# Patient Record
Sex: Female | Born: 1995 | Race: Black or African American | Hispanic: No | Smoking: Current every day smoker
Health system: Southern US, Community
[De-identification: ages and names within clinical notes are randomized; demographics above are authoritative.]

## PROBLEM LIST (undated history)

## (undated) DIAGNOSIS — F32A Depression, unspecified: Secondary | ICD-10-CM

## (undated) DIAGNOSIS — F99 Mental disorder, not otherwise specified: Secondary | ICD-10-CM

## (undated) DIAGNOSIS — D649 Anemia, unspecified: Secondary | ICD-10-CM

## (undated) DIAGNOSIS — R519 Headache, unspecified: Secondary | ICD-10-CM

## (undated) DIAGNOSIS — R51 Headache: Secondary | ICD-10-CM

## (undated) DIAGNOSIS — J45909 Unspecified asthma, uncomplicated: Secondary | ICD-10-CM

## (undated) DIAGNOSIS — R569 Unspecified convulsions: Secondary | ICD-10-CM

## (undated) DIAGNOSIS — F419 Anxiety disorder, unspecified: Secondary | ICD-10-CM

## (undated) DIAGNOSIS — F329 Major depressive disorder, single episode, unspecified: Secondary | ICD-10-CM

## (undated) HISTORY — PX: EXTERNAL EAR SURGERY: SHX627

## (undated) HISTORY — PX: HAND SURGERY: SHX662

## (undated) HISTORY — DX: Depression, unspecified: F32.A

## (undated) SURGERY — Surgical Case
Anesthesia: *Unknown

---
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
## 2015-06-08 ENCOUNTER — Emergency Department (HOSPITAL_COMMUNITY)
Admission: EM | Admit: 2015-06-08 | Discharge: 2015-06-09 | Payer: Medicaid Other | Attending: Emergency Medicine | Admitting: Emergency Medicine

## 2015-06-08 ENCOUNTER — Encounter (HOSPITAL_COMMUNITY): Payer: Self-pay | Admitting: Emergency Medicine

## 2015-06-08 DIAGNOSIS — Z79899 Other long term (current) drug therapy: Secondary | ICD-10-CM | POA: Insufficient documentation

## 2015-06-08 DIAGNOSIS — F329 Major depressive disorder, single episode, unspecified: Secondary | ICD-10-CM

## 2015-06-08 DIAGNOSIS — F32A Depression, unspecified: Secondary | ICD-10-CM

## 2015-06-08 HISTORY — DX: Unspecified convulsions: R56.9

## 2015-06-08 LAB — RAPID URINE DRUG SCREEN, HOSP PERFORMED
Amphetamines: NOT DETECTED
Barbiturates: NOT DETECTED
Benzodiazepines: NOT DETECTED
COCAINE: NOT DETECTED
Opiates: NOT DETECTED
TETRAHYDROCANNABINOL: NOT DETECTED

## 2015-06-08 LAB — CBC
HCT: 40.4 % (ref 36.0–46.0)
Hemoglobin: 13.4 g/dL (ref 12.0–15.0)
MCH: 28.5 pg (ref 26.0–34.0)
MCHC: 33.2 g/dL (ref 30.0–36.0)
MCV: 86 fL (ref 78.0–100.0)
Platelets: 249 10*3/uL (ref 150–400)
RBC: 4.7 MIL/uL (ref 3.87–5.11)
RDW: 14 % (ref 11.5–15.5)
WBC: 6.2 10*3/uL (ref 4.0–10.5)

## 2015-06-08 LAB — COMPREHENSIVE METABOLIC PANEL
ALK PHOS: 79 U/L (ref 38–126)
ALT: 14 U/L (ref 14–54)
ANION GAP: 10 (ref 5–15)
AST: 19 U/L (ref 15–41)
Albumin: 4.5 g/dL (ref 3.5–5.0)
BILIRUBIN TOTAL: 0.7 mg/dL (ref 0.3–1.2)
BUN: 12 mg/dL (ref 6–20)
CO2: 23 mmol/L (ref 22–32)
CREATININE: 0.85 mg/dL (ref 0.44–1.00)
Calcium: 9.6 mg/dL (ref 8.9–10.3)
Chloride: 104 mmol/L (ref 101–111)
GFR calc non Af Amer: >60 mL/min (ref >60)
Glucose, Bld: 115 mg/dL — ABNORMAL HIGH (ref 65–99)
Potassium: 3.8 mmol/L (ref 3.5–5.1)
SODIUM: 137 mmol/L (ref 135–145)
TOTAL PROTEIN: 7.8 g/dL (ref 6.5–8.1)

## 2015-06-08 LAB — SALICYLATE LEVEL: Salicylate Lvl: <4 mg/dL (ref 2.8–30.0)

## 2015-06-08 LAB — ETHANOL: Alcohol, Ethyl (B): <5 mg/dL (ref <5)

## 2015-06-08 LAB — ACETAMINOPHEN LEVEL

## 2015-06-08 NOTE — ED Notes (Signed)
Pt. wanded by security at triage .  

## 2015-06-08 NOTE — ED Notes (Signed)
Pt placed in wine colored scrubs 

## 2015-06-08 NOTE — ED Notes (Signed)
Pt. reports suicidal ideation plans to jump over a bridge , denies visual/auditory hallucinations .

## 2015-06-08 NOTE — ED Provider Notes (Signed)
CSN: [id number]     Arrival date & time 06/08/15  2208 History   First MD Initiated Contact with Patient 06/08/15 2354     Chief Complaint  Patient presents with  . Suicidal     (Consider location/radiation/quality/duration/timing/severity/associated sxs/prior Treatment) The history is provided by the patient.  Patient notes hx ptsd, and states has been feeling very depressed lately relating to recent issues with friend/girlfriend, and being kicked out of grandparents home where she had been staying.  Patient indicates she now is having thoughts of jumping off a bridge. Denies any attempt at harming self, or od.  Patient initially had been in ED with her girlfriend who had checked in as patient, and then decided to check in and be seen herself. Currently/newly homeless.  States normal appetite. No recent illness or physical symptoms.       Past Medical History  Diagnosis Date  . Mental disorder   . Depression   . Seizures [facility](HCC)    Past Surgical History  Procedure Laterality Date  . Foreign body removal Right 03/16/2013    Procedure: REMOVAL FOREIGN BODY EXTREMITY;  Surgeon: Sharma CovertFred W Ortmann, MD;  Location: [facility] OR;  Service: Orthopedics;  Laterality: Right;  . Hand surgery    . External ear surgery     No family history on file. Social History  Substance Use Topics  . Smoking status: Never Smoker   . Smokeless tobacco: Never Used  . Alcohol Use: No   OB History    No data available     Review of Systems  Constitutional: Negative for fever.  HENT: Negative for sore throat.   Eyes: Negative for redness.  Respiratory: Negative for shortness of breath.   Cardiovascular: Negative for chest pain.  Gastrointestinal: Negative for vomiting and abdominal pain.  Genitourinary: Negative for flank pain.  Musculoskeletal: Negative for back pain and neck pain.  Skin: Negative for rash.  Neurological: Negative for headaches.  Hematological: Does not bruise/bleed easily.   Psychiatric/Behavioral: Positive for dysphoric mood.      Allergies  Bee venom  Home Medications   Prior to Admission medications   Medication Sig Start Date End Date Taking? Authorizing Provider  acetaminophen (TYLENOL) 325 MG tablet Take 650 mg by mouth every 6 (six) hours as needed for mild pain.    Historical Provider, MD  albuterol (PROVENTIL HFA;VENTOLIN HFA) 108 (90 BASE) MCG/ACT inhaler Inhale 2 puffs into the lungs every 6 (six) hours as needed for wheezing. For wheezing 03/07/13   Jolene SchimkeKim B Winson, NP  cloNIDine (CATAPRES) 0.1 MG tablet Take 0.05-0.1 mg by mouth 3 (three) times daily. Take 1 tablet every morning and every evening and take 1/2 tablet at lunch 03/07/13   Jolene SchimkeKim B Winson, NP  docusate sodium (COLACE) 100 MG capsule Take 100 mg by mouth daily.    Historical Provider, MD  escitalopram (LEXAPRO) 10 MG tablet Take 10 mg by mouth daily.    Historical Provider, MD  ibuprofen (ADVIL,MOTRIN) 600 MG tablet Take 1 tablet (600 mg total) by mouth every 6 (six) hours as needed. 10/29/13   Francee PiccoloJennifer Piepenbrink, PA-C  Melatonin 3 MG TABS Take 1 tablet by mouth at bedtime.    Historical Provider, MD  polyethylene glycol (MIRALAX / GLYCOLAX) packet Take 17 g by mouth daily.    Historical Provider, MD  QUEtiapine (SEROQUEL) 100 MG tablet Take 100-150 mg by mouth 2 (two) times daily. Take 1 tablet every morning and take 1 and 1/2 tablet at lunch  Historical Provider, MD  QUEtiapine (SEROQUEL) 300 MG tablet Take 300 mg by mouth at bedtime. Take one in the morning and two at night    Historical Provider, MD   BP 121/74 mmHg  Pulse 90  Temp(Src) 98.2 F (36.8 C) (Oral)  Resp 18  Wt 72.32 kg  SpO2 100%  LMP  Physical Exam  Constitutional: She is oriented to person, place, and time. She appears well-developed and well-nourished. No distress.  HENT:  Mouth/Throat: Oropharynx is clear and moist.  Eyes: Conjunctivae are normal. Pupils are equal, round, and reactive to light. No scleral  icterus.  Neck: Neck supple. No tracheal deviation present.  Cardiovascular: Normal rate, regular rhythm, normal heart sounds and intact distal pulses.   Pulmonary/Chest: Effort normal and breath sounds normal. No respiratory distress.  Abdominal: Normal appearance. She exhibits no distension. There is no tenderness.  Musculoskeletal: She exhibits no edema.  Neurological: She is alert and oriented to person, place, and time.  Steady gait.   Skin: Skin is warm and dry. No rash noted. She is not diaphoretic.  Psychiatric: She has a normal mood and affect.  Pt voices SI, while is alert, smiling, interactive, conversant, and appearing to exhibit normal mood/affect.   Nursing note and vitals reviewed.   ED Course  Procedures (including critical care time) Labs Review   Results for orders placed or performed during the hospital encounter of 06/08/15  Comprehensive metabolic panel  Result Value Ref Range   Sodium 137 135 - 145 mmol/L   Potassium 3.8 3.5 - 5.1 mmol/L   Chloride 104 101 - 111 mmol/L   CO2 23 22 - 32 mmol/L   Glucose, Bld 115 (H) 65 - 99 mg/dL   BUN 12 6 - 20 mg/dL   Creatinine, Ser 1.61 0.44 - 1.00 mg/dL   Calcium 9.6 8.9 - 09.6 mg/dL   Total Protein 7.8 6.5 - 8.1 g/dL   Albumin 4.5 3.5 - 5.0 g/dL   AST 19 15 - 41 U/L   ALT 14 14 - 54 U/L   Alkaline Phosphatase 79 38 - 126 U/L   Total Bilirubin 0.7 0.3 - 1.2 mg/dL   GFR calc non Af Amer >60 >60 mL/min   GFR calc Af Amer >60 >60 mL/min   Anion gap 10 5 - 15  Ethanol (ETOH)  Result Value Ref Range   Alcohol, Ethyl (B) <5 <5 mg/dL  Salicylate level  Result Value Ref Range   Salicylate Lvl <4.0 2.8 - 30.0 mg/dL  Acetaminophen level  Result Value Ref Range   Acetaminophen (Tylenol), Serum <10 (L) 10 - 30 ug/mL  CBC  Result Value Ref Range   WBC 6.2 4.0 - 10.5 K/uL   RBC 4.70 3.87 - 5.11 MIL/uL   Hemoglobin 13.4 12.0 - 15.0 g/dL   HCT 04.5 40.9 - 81.1 %   MCV 86.0 78.0 - 100.0 fL   MCH 28.5 26.0 - 34.0 pg    MCHC 33.2 30.0 - 36.0 g/dL   RDW 91.4 78.2 - 95.6 %   Platelets 249 150 - 400 K/uL  Urine rapid drug screen (hosp performed) (Not at [facility])  Result Value Ref Range   Opiates NONE DETECTED NONE DETECTED   Cocaine NONE DETECTED NONE DETECTED   Benzodiazepines NONE DETECTED NONE DETECTED   Amphetamines NONE DETECTED NONE DETECTED   Tetrahydrocannabinol NONE DETECTED NONE DETECTED   Barbiturates NONE DETECTED NONE DETECTED      I have personally reviewed and evaluated these lab results  as part of my medical decision-making.    MDM   Labs.  Behavioral health team consulted.  Temp psych holding orders placed.  Disposition per psych team.  Reviewed nursing notes and prior charts for additional history.      Cathren Laine, MD 06/09/15 Marlyne Beards

## 2015-06-08 NOTE — ED Notes (Signed)
Staffing office notified for pt.'s sitter , security notified to wand pt. , purple scrubs given to pt. At triage .

## 2015-06-09 MED ORDER — IBUPROFEN 400 MG PO TABS
600.0000 mg | ORAL_TABLET | Freq: Four times a day (QID) | ORAL | Status: DC | PRN
  Administered 2015-06-09: 600 mg via ORAL
  Filled 2015-06-09: qty 1

## 2015-06-09 NOTE — ED Provider Notes (Signed)
  Physical Exam  BP 100/54 mmHg  Pulse 77  Temp(Src) 98.2 F (36.8 C) (Oral)  Resp 16  Wt 159 lb 7 oz (72.32 kg)  SpO2 98%  LMP   Physical Exam  ED Course  Procedures  MDM Accepted at [facility]. By Dr. Wendall StadeKohl. Patient reevaluated by myself before transfer.      Benjiman CoreNathan Ahri Olson, MD 06/09/15 1048
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
## 2015-06-09 NOTE — Progress Notes (Addendum)
Discussed pt's case with psych team.   Pt has been accepted at [facility] by Dr. Wendall StadeKohl. Per Christiane HaJonathan in intake, report can be called at [phone]. Admission is voluntary and pt can arrive anytime.   Spoke with [facility] re: pt's placement  Ilean SkillMeghan Addilyn Satterwhite, MSW, LCSW Clinical Social Work, Disposition  06/09/2015 [phone]
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
## 2015-06-09 NOTE — ED Notes (Signed)
Patient was given a snack and drink. Regular diet order taken for lunch. 

## 2015-06-09 NOTE — ED Notes (Signed)
Belonging given to [employer] driver.

## 2015-06-28 ENCOUNTER — Emergency Department (HOSPITAL_COMMUNITY)
Admission: EM | Admit: 2015-06-28 | Discharge: 2015-06-28 | Disposition: A | Discharge status: Home / Self Care | Payer: Medicaid Other | Attending: Emergency Medicine | Admitting: Emergency Medicine

## 2015-06-28 ENCOUNTER — Encounter (HOSPITAL_COMMUNITY): Payer: Self-pay | Admitting: *Deleted

## 2015-06-28 DIAGNOSIS — R569 Unspecified convulsions: Secondary | ICD-10-CM | POA: Insufficient documentation

## 2015-06-28 DIAGNOSIS — F445 Conversion disorder with seizures or convulsions: Secondary | ICD-10-CM

## 2015-06-28 DIAGNOSIS — Z8659 Personal history of other mental and behavioral disorders: Secondary | ICD-10-CM | POA: Insufficient documentation

## 2015-06-28 DIAGNOSIS — Z79899 Other long term (current) drug therapy: Secondary | ICD-10-CM | POA: Insufficient documentation

## 2015-06-28 NOTE — ED Provider Notes (Signed)
CSN: [id number]     Arrival date & time 06/28/15  1757 History   First MD Initiated Contact with Patient 06/28/15 1813     Chief Complaint  Patient presents with  . Seizures     (Consider location/radiation/quality/duration/timing/severity/associated sxs/prior Treatment) Patient is a 19 y.o. female presenting with seizures. The history is provided by the patient. No language interpreter was used.  Seizures Seizure activity on arrival: yes   Seizure type: hx of pseudoseizures. Preceding symptoms: no sensation of an aura present, no dizziness and no headache   Initial focality:  None Episode characteristics: generalized shaking   Return to baseline: yes   Severity:  Unable to specify Timing:  Once Number of seizures this episode:  1 Progression:  Worsening Context: not alcohol withdrawal, not cerebral palsy, not previous head injury and not stress   Pt is bipolar and has a history of pseudoseizures.  Pt is suppose to take depakote but does not take  Past Medical History  Diagnosis Date  . Mental disorder   . Depression   . Seizures [facility](HCC)    Past Surgical History  Procedure Laterality Date  . Foreign body removal Right 03/16/2013    Procedure: REMOVAL FOREIGN BODY EXTREMITY;  Surgeon: Sharma CovertFred W Ortmann, [place];  Location: [facility] OR;  Service: Orthopedics;  Laterality: Right;  . Hand surgery    . External ear surgery     History reviewed. No pertinent family history. Social History  Substance Use Topics  . Smoking status: Never Smoker   . Smokeless tobacco: Never Used  . Alcohol Use: No   OB History    No data available     Review of Systems  Neurological: Positive for seizures.  All other systems reviewed and are negative.     Allergies  Bee venom  Home Medications   Prior to Admission medications   Medication Sig Start Date End Date Taking? Authorizing Provider  albuterol (PROVENTIL HFA;VENTOLIN HFA) 108 (90 BASE) MCG/ACT inhaler Inhale 2 puffs into the lungs every 6  (six) hours as needed for wheezing. For wheezing 03/07/13  Yes Jolene SchimkeKim B Winson, NP  divalproex (DEPAKOTE) 500 MG DR tablet Take 500 mg by mouth 3 (three) times daily.   Yes Historical Provider, MD  lurasidone (LATUDA) 40 MG TABS tablet Take 40 mg by mouth daily with breakfast.   Yes Historical Provider, MD  cloNIDine (CATAPRES) 0.1 MG tablet Take 0.05-0.1 mg by mouth 3 (three) times daily. Take 1 tablet every morning and every evening and take 1/2 tablet at lunch 03/07/13   Jolene SchimkeKim B Winson, NP   BP 128/85 mmHg  Pulse 79  Temp(Src) 98.1 F (36.7 C) (Oral)  Resp 14  SpO2 100% Physical Exam  Constitutional: She is oriented to person, place, and time. She appears well-developed and well-nourished.  HENT:  Head: Normocephalic and atraumatic.  Right Ear: External ear normal.  Mouth/Throat: Oropharynx is clear and moist.  Eyes: Conjunctivae and EOM are normal. Pupils are equal, round, and reactive to light.  Neck: Normal range of motion.  Cardiovascular: Normal rate and regular rhythm.   Pulmonary/Chest: Effort normal and breath sounds normal.  Abdominal: Soft. She exhibits no distension.  Musculoskeletal: Normal range of motion.  Neurological: She is alert and oriented to person, place, and time.  Skin: Skin is warm.  Psychiatric: She has a normal mood and affect.  Nursing note and vitals reviewed.   ED Course  Procedures (including critical care time) Labs Review Labs Reviewed - No data to  display  Imaging Review No results found. I have personally reviewed and evaluated these images and lab results as part of my medical decision-making.   EKG Interpretation   Date/Time:  Monday June 28 2015 18:04:22 EST Ventricular Rate:  79 PR Interval:  124 QRS Duration: 68 QT Interval:  335 QTC Calculation: 384 R Axis:   82 Text Interpretation:  Sinus rhythm No significant change since last  tracing Confirmed by Denton Lank  MD, Caryn Bee ([id number]) on 06/28/2015 7:08:10 PM      MDM   Final  diagnoses:  Pseudoseizure (HCC)    Pt observed x 1.5 hours.  Pt denies current depression or anxiety.   I advised pt to take her depakote as directed.     Lonia Skinner [place], PA-C 06/28/15 1923  Cathren Laine, MD 06/29/15 [phone]

## 2015-06-28 NOTE — Discharge Instructions (Signed)
Nonepileptic Seizures °Nonepileptic seizures are seizures that are not caused by abnormal electrical signals in your brain. These seizures often seem like epileptic seizures, but they are not caused by epilepsy.  °There are two types of nonepileptic seizures: °· A physiologic nonepileptic seizure results from a disruption in your brain. °· A psychogenic seizure results from emotional stress. These seizures are sometimes called pseudoseizures. °CAUSES  °Causes of physiologic nonepileptic seizures include:  °· Sudden drop in blood pressure. °· Low blood sugar. °· Low levels of salt (sodium) in your blood. °· Low levels of calcium in your blood. °· Migraine. °· Heart rhythm problems. °· Sleep disorders. °· Drug and alcohol abuse. °Common causes of psychogenic nonepileptic seizures include: °· Stress. °· Emotional trauma. °· Sexual or physical abuse. °· Major life events, such as divorce or the death of a loved one. °· Mental health disorders, including panic attack and hyperactivity disorder. °SIGNS AND SYMPTOMS °A nonepileptic seizure can look like an epileptic seizure, including uncontrollable shaking (convulsions), or changes in attention, behavior, or the ability to remain awake and alert. However, there are some differences. Nonepileptic seizures usually: °· Do not cause physical injuries. °· Start slowly. °· Include crying or shrieking. °· Last longer than 2 minutes. °· Have a short recovery time without headache or exhaustion. °DIAGNOSIS  °Your health care provider can usually diagnose nonepileptic seizures after taking your medical history and giving you a physical exam. Your health care provider may want to talk to your friends or relatives who have seen you have a seizure.  °You may also need to have tests to look for causes of physiologic nonepileptic seizures. This may include an electroencephalogram (EEG), which is a test that measures electrical activity in your brain. If you have had an epileptic  seizure, the results of your EEG will be abnormal. If your health care provider thinks you have had a psychogenic nonepileptic seizure, you may need to see a mental health specialist for an evaluation. °TREATMENT  °Treatment depends on the type and cause of your seizures. °· For physiologic nonepileptic seizures, treatment is aimed at addressing the underlying condition that caused the seizures. These seizures usually stop when the underlying condition is properly treated. °· Nonepileptic seizures do not respond to the seizure medicines used to treat epilepsy. °· For psychogenic seizures, you may need to work with a mental health specialist. °HOME CARE INSTRUCTIONS °Home care will depend on the type of nonepileptic seizures you have.  °· Follow all your health care provider's instructions. °· Keep all your follow-up appointments. °SEEK MEDICAL CARE IF: °You continue to have seizures after treatment. °SEEK IMMEDIATE MEDICAL CARE IF: °· Your seizures change or become more frequent. °· You injure yourself during a seizure. °· You have one seizure after another. °· You have trouble recovering from a seizure. °· You have chest pain or trouble breathing. °MAKE SURE YOU: °· Understand these instructions. °· Will watch your condition. °· Will get help right away if you are not doing well or get worse. °  °This information is not intended to replace advice given to you by your health care provider. Make sure you discuss any questions you have with your health care provider. °  °Document Released: 08/11/2005 Document Revised: 07/17/2014 Document Reviewed: 04/22/2013 °Elsevier Interactive Patient Education ©2016 Elsevier Inc. ° °

## 2015-06-28 NOTE — ED Notes (Signed)
Pt arrives from home via [facility]. Pt states she has a hx of pseudoseizures and bipolar disorder. Pt states she began having some shaking at 0600 today and fell down her stairs onto her right side and has c/o right sided rib pain. Pt states at 1730 pt began having eye rolling so called EMS. Pt was at home alone for both occurences. Pt also states she was prescribed depakote and took one dose on Wednesday and hasn't had anymore. Pt denies neck or back pain, loss of bowel or bladder fx or tongue injury.

## 2015-07-14 ENCOUNTER — Other Ambulatory Visit: Payer: Self-pay

## 2015-07-14 ENCOUNTER — Encounter (HOSPITAL_COMMUNITY): Payer: Self-pay | Admitting: *Deleted

## 2015-07-14 ENCOUNTER — Emergency Department (HOSPITAL_COMMUNITY)
Admission: EM | Admit: 2015-07-14 | Discharge: 2015-07-16 | Disposition: A | Discharge status: Other Acute Inpatient Hospital | Payer: Self-pay | Attending: Emergency Medicine | Admitting: Emergency Medicine

## 2015-07-14 DIAGNOSIS — T50904A Poisoning by unspecified drugs, medicaments and biological substances, undetermined, initial encounter: Secondary | ICD-10-CM

## 2015-07-14 DIAGNOSIS — T39314A Poisoning by propionic acid derivatives, undetermined, initial encounter: Secondary | ICD-10-CM | POA: Insufficient documentation

## 2015-07-14 DIAGNOSIS — X58XXXA Exposure to other specified factors, initial encounter: Secondary | ICD-10-CM | POA: Insufficient documentation

## 2015-07-14 DIAGNOSIS — Z3202 Encounter for pregnancy test, result negative: Secondary | ICD-10-CM | POA: Insufficient documentation

## 2015-07-14 DIAGNOSIS — Y998 Other external cause status: Secondary | ICD-10-CM | POA: Insufficient documentation

## 2015-07-14 DIAGNOSIS — Y9289 Other specified places as the place of occurrence of the external cause: Secondary | ICD-10-CM | POA: Insufficient documentation

## 2015-07-14 DIAGNOSIS — R4182 Altered mental status, unspecified: Secondary | ICD-10-CM | POA: Insufficient documentation

## 2015-07-14 DIAGNOSIS — Y9389 Activity, other specified: Secondary | ICD-10-CM | POA: Insufficient documentation

## 2015-07-14 DIAGNOSIS — F329 Major depressive disorder, single episode, unspecified: Secondary | ICD-10-CM | POA: Diagnosis present

## 2015-07-14 LAB — I-STAT CG4 LACTIC ACID, ED: Lactic Acid, Venous: 1.81 mmol/L (ref 0.5–2.0)

## 2015-07-14 LAB — COMPREHENSIVE METABOLIC PANEL
ALBUMIN: 4 g/dL (ref 3.5–5.0)
ALK PHOS: 71 U/L (ref 38–126)
ALT: 11 U/L — ABNORMAL LOW (ref 14–54)
ANION GAP: 11 (ref 5–15)
AST: 16 U/L (ref 15–41)
BILIRUBIN TOTAL: 0.7 mg/dL (ref 0.3–1.2)
CALCIUM: 9.6 mg/dL (ref 8.9–10.3)
CO2: 21 mmol/L — ABNORMAL LOW (ref 22–32)
Chloride: 107 mmol/L (ref 101–111)
Creatinine, Ser: 0.68 mg/dL (ref 0.44–1.00)
GFR calc Af Amer: >60 mL/min (ref >60)
GLUCOSE: 106 mg/dL — AB (ref 65–99)
Potassium: 3.5 mmol/L (ref 3.5–5.1)
Sodium: 139 mmol/L (ref 135–145)
TOTAL PROTEIN: 7.7 g/dL (ref 6.5–8.1)

## 2015-07-14 LAB — I-STAT VENOUS BLOOD GAS, ED
Acid-Base Excess: 2 mmol/L (ref 0.0–2.0)
Bicarbonate: 24.9 mEq/L — ABNORMAL HIGH (ref 20.0–24.0)
O2 Saturation: 86 %
PCO2 VEN: 34.6 mmHg — AB (ref 45.0–50.0)
PH VEN: 7.464 — AB (ref 7.250–7.300)
TCO2: 26 mmol/L (ref 0–100)
pO2, Ven: 48 mmHg — ABNORMAL HIGH (ref 30.0–45.0)

## 2015-07-14 LAB — SALICYLATE LEVEL

## 2015-07-14 LAB — RAPID URINE DRUG SCREEN, HOSP PERFORMED
AMPHETAMINES: NOT DETECTED
BENZODIAZEPINES: NOT DETECTED
Barbiturates: NOT DETECTED
Cocaine: NOT DETECTED
OPIATES: NOT DETECTED
Tetrahydrocannabinol: NOT DETECTED

## 2015-07-14 LAB — POC URINE PREG, ED: Preg Test, Ur: NEGATIVE

## 2015-07-14 LAB — I-STAT BETA HCG BLOOD, ED (MC, WL, AP ONLY): I-stat hCG, quantitative: <5 m[IU]/mL (ref <5)

## 2015-07-14 LAB — LIPASE, BLOOD: Lipase: 20 U/L (ref 11–51)

## 2015-07-14 LAB — ACETAMINOPHEN LEVEL: Acetaminophen (Tylenol), Serum: <10 ug/mL — ABNORMAL LOW (ref 10–30)

## 2015-07-14 LAB — CK: Total CK: 83 U/L (ref 38–234)

## 2015-07-14 LAB — ETHANOL

## 2015-07-14 LAB — I-STAT TROPONIN, ED: TROPONIN I, POC: 0 ng/mL (ref 0.00–0.08)

## 2015-07-14 NOTE — ED Notes (Signed)
The pt is sitting in the middle of the stretcher.  Talking now to me and she is acting totally normal.  History received from the pt.  She lives with her grandmother who has not been here,

## 2015-07-14 NOTE — ED Notes (Signed)
Sitter ay the bedside.  Pt sleeping  In fluid nss added to saline lok

## 2015-07-14 NOTE — ED Notes (Signed)
The pt is talking to the sitter but no one else.  She only nods or shakes her head to questions asked by other people.  She also wants to call her grandmother according to the sitter

## 2015-07-14 NOTE — ED Notes (Signed)
Sitter at bedside.  The pt reports that she did not take advil she took gabapentin  approx 12 pills.  She does not know what time she took them.  She reports that she was not trying to kill  Herself.  She just took them to get away for awhile.  She reports that she just did not have anyone to talk to because her grandmother was not there.  She reports that mental facilities do not help her.  Her grandmother helps her

## 2015-07-14 NOTE — ED Notes (Signed)
ataffing office notified of the need for a sitter

## 2015-07-14 NOTE — ED Notes (Signed)
Poison control called asking for lab results

## 2015-07-14 NOTE — ED Notes (Signed)
Pt standing beside the bed  Shed camed out the end of the stretcher.  Both side rfails were still uprightg.  helpoed back to the bed    Groaning and making animal noises

## 2015-07-14 NOTE — ED Notes (Signed)
The pt is turning and looking at people in the room making statements.  She was shaking and looking to the rt side for apporx 203m0 seconds.  After intra-orbital pressure was applied she stopped and opened her eyes.  She is shaking her yes and no  To some questions asked.  Rectal temp obtained she did not like that.  i asked if she could void on a bedpan she shrugged her shoulders as if in question.

## 2015-07-14 NOTE — ED Notes (Signed)
The pt is opening her eyes to some statements.  Not answering question yet but she reached up and pulled her nasal trumpet out

## 2015-07-14 NOTE — ED Notes (Signed)
Pt is sitting up in the bed talking to the sitter and answering questions. Stated that she didn't know what she was doing. Thayer Ohmhris RN notified

## 2015-07-14 NOTE — ED Notes (Signed)
The pt arrived by [facility] from home  Grandmother called on the phone and the pt told her that she had taken motrin..  Pt blinks her eyes when she is being spoken to

## 2015-07-15 DIAGNOSIS — F329 Major depressive disorder, single episode, unspecified: Secondary | ICD-10-CM | POA: Diagnosis present

## 2015-07-15 MED ORDER — ACETAMINOPHEN 325 MG PO TABS
650.0000 mg | ORAL_TABLET | Freq: Four times a day (QID) | ORAL | Status: DC | PRN
  Administered 2015-07-15: 650 mg via ORAL
  Filled 2015-07-15: qty 2

## 2015-07-15 MED ORDER — ONDANSETRON 4 MG PO TBDP
4.0000 mg | ORAL_TABLET | Freq: Once | ORAL | Status: AC
  Administered 2015-07-15: 4 mg via ORAL
  Filled 2015-07-15: qty 1

## 2015-07-15 MED ORDER — ALUM & MAG HYDROXIDE-SIMETH 200-200-20 MG/5ML PO SUSP: 30.0000 mL | ORAL | Status: DC | PRN

## 2015-07-15 MED ORDER — MAGNESIUM HYDROXIDE 400 MG/5ML PO SUSP: 30.0000 mL | Freq: Every day | ORAL | Status: DC | PRN

## 2015-07-15 MED ORDER — TRAZODONE HCL 100 MG PO TABS: 100.0000 mg | ORAL_TABLET | Freq: Every evening | ORAL | Status: DC | PRN

## 2015-07-15 NOTE — ED Provider Notes (Signed)
The patient appears reasonably stabilized for transfer considering the current resources, flow, and capabilities available in the ED at this time, and I doubt any other [facility] requiring further screening and/or treatment in the ED prior to transfer.  BP 135/76 mmHg  Pulse 76  Temp(Src) 98.2 F (36.8 C) (Oral)  Resp 18  SpO2 100%  LMP 07/07/2015   Zadie Rhineonald Kevonte Vanecek, MD 07/15/15 2359

## 2015-07-15 NOTE — ED Notes (Signed)
Emily ClinesDebra Massey [phone] (Grandmother). She would like to called with placement.

## 2015-07-15 NOTE — ED Notes (Signed)
Report given to the rn in pod c

## 2015-07-15 NOTE — ED Provider Notes (Signed)
CSN: [id number]     Arrival date & time 07/14/15  1821 History   First MD Initiated Contact with Patient 07/14/15 1840     Chief Complaint  Patient presents with  . Ingestion     (Consider location/radiation/quality/duration/timing/severity/associated sxs/prior Treatment) HPI Comments: 20 year old female with unknown past medical history who presents with altered mental status. History limited because of the patient's altered mentation and obtained primarily from EMS. EMS reports that the patient's grandmother spoke to the patient on the phone and noted that she was altered and stated that she had taken Motrin. Grandmother called EMS and they found her altered, somnolent, with only intermittent opening of her eyes to voice. It is unknown whether her ingestion was intentional or accidental.  Patient is a 20 y.o. female presenting with Ingested Medication. The history is provided by the EMS personnel.  Ingestion    History reviewed. No pertinent past medical history. History reviewed. No pertinent past surgical history. No family history on file. Social History  Substance Use Topics  . Smoking status: Never Smoker   . Smokeless tobacco: None  . Alcohol Use: No   OB History    No data available     Review of Systems  Unable to perform ROS: Mental status change      Allergies  Bee pollen-1000-royal jelly  Home Medications   Prior to Admission medications   Not on File   BP 107/81 mmHg  Pulse 86  Resp 15  SpO2 95%  LMP 07/07/2015 Physical Exam  Constitutional:  Somnolent, NAD  HENT:  Head: Normocephalic and atraumatic.  R nasal trumpet, Moist mucous membranes  Eyes: Conjunctivae are normal. Pupils are equal, round, and reactive to light.  Neck: Neck supple.  Cardiovascular: Normal rate, regular rhythm and normal heart sounds.   No murmur heard. Pulmonary/Chest: Effort normal and breath sounds normal.  Abdominal: Soft. Bowel sounds are normal. She exhibits no  distension. There is no tenderness.  Musculoskeletal: She exhibits no edema or tenderness.  Neurological:  GCS 9, not answering questions or following commands  Skin: Skin is warm and dry. No rash noted.  Healed linear scars on forearms  Nursing note and vitals reviewed.   ED Course  Procedures (including critical care time) Labs Review Labs Reviewed  ACETAMINOPHEN LEVEL - Abnormal; Notable for the following:    Acetaminophen (Tylenol), Serum <10 (*)    All other components within normal limits  COMPREHENSIVE METABOLIC PANEL - Abnormal; Notable for the following:    CO2 21 (*)    Glucose, Bld 106 (*)    BUN <5 (*)    ALT 11 (*)    All other components within normal limits  I-STAT VENOUS BLOOD GAS, ED - Abnormal; Notable for the following:    pH, Ven 7.464 (*)    pCO2, Ven 34.6 (*)    pO2, Ven 48.0 (*)    Bicarbonate 24.9 (*)    All other components within normal limits  ETHANOL  LIPASE, BLOOD  SALICYLATE LEVEL  CK  URINE RAPID DRUG SCREEN, HOSP PERFORMED  BLOOD GAS, VENOUS  I-STAT CG4 LACTIC ACID, ED  I-STAT BETA HCG BLOOD, ED ([facility], [facility], [facility] ONLY)  I-STAT TROPOININ, ED  POC URINE PREG, ED    Imaging Review No results found. I have personally reviewed and evaluated these images and lab results as part of my medical decision-making.   EKG Interpretation None      Sinus rhythm, normal rate, normal axis, no QTC prolongation MDM  Final diagnoses:  Overdose, undetermined intent, initial encounter  Altered mental status, unspecified altered mental status type   patient presents by EMS for altered mental status and concern for overdose. On arrival, she was somnolent but with stable vital signs. She initially would not respond to voice or painful stimuli but within 10-15 minutes of arrival, she became more responsive. She later stated to me that she took 12, 800 mg tablets of her grandparent's gabapentin. She denied trying to kill herself with it. EKG unremarkable. Obtained  above labs. Spoke w/ poison control, who recommended 6 hour observation period. labs showed normal lactate, negative UDS, negative Tylenol, salicylate, and alcohol. I am concerned about the patient's large ingestion as a possible suicide attempt. The patient has become more responsive during her ED course and is now conversant with staff. The patient has a sitter at bedside and I have contacted TTS for evaluation. Pt's disposition will be determined by psychiatry team recommendations.   Laurence Spates, MD 07/15/15 [phone]

## 2015-07-15 NOTE — ED Notes (Signed)
Just met pt in rm.  Bagged pt's robe for inventory.

## 2015-07-15 NOTE — ED Notes (Signed)
Sitter at the bedside.  The pt is now just being silly.  She has been speaking in a baby voice intermittently.  She was demanding to have the iv removed edp agreed. She had all her vital sign taking equipment removed.  Drinking gingerale.  tts being attempted.  Adult machine not working  Peds machine brought for her tts

## 2015-07-15 NOTE — ED Notes (Signed)
Patient given updates.
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
## 2015-07-15 NOTE — BHH Counselor (Signed)
Per Donell SievertSpencer Simon, PA, Pt meets inpt tx criteria. Per Clint Bolderori Beck, [facility], [facility]. TTS to seek placement at outside facilities in the meantime.  Counselor informed EDP of disposition.   - Cyndie MullAnna Koleton Duchemin, [facility]   Therapeutic Triage    Ext [phone]

## 2015-07-15 NOTE — ED Notes (Signed)
Pt placed in scrubs  And moved to pod c.  Security will wand

## 2015-07-15 NOTE — BH Assessment (Signed)
[facility]  Accepting physician is Dr. Ardyth HarpsHernandez.  Attending Physician will be Dr. Ardyth HarpsHernandez.  Patient [facility] room 303[facility] Charge Nurse Continental CourtsGwen.  Call report to [phone].  Representative/Transfer Coordinator is Sander Remedios.  [facility] Staff (Jody, Social Worker) made aware of acceptance.   Patient is able to come to the unit after 9pm.  That is when their bed will be available.

## 2015-07-15 NOTE — ED Provider Notes (Signed)
BP 108/70 mmHg  Pulse 60  Temp(Src) 98.2 F (36.8 C) (Oral)  Resp 18  SpO2 100%  LMP 07/07/2015 Pt awaiting placement at this time Pt has been eating/drinking per nursing  Zadie Rhineonald Jahad Old, MD 07/15/15 1625

## 2015-07-15 NOTE — ED Notes (Signed)
Patient was given a snack and drink, A regular diet order taken for lunch. 

## 2015-07-15 NOTE — ED Notes (Signed)
Patient given snack.  

## 2015-07-15 NOTE — ED Notes (Signed)
Patient at nursing station on the phone with grandparents.

## 2015-07-15 NOTE — Progress Notes (Signed)
Seeking inpatient placement for pt. [facility].  [facility]  Declined: [facility]- acuity too high for unit at this time per [facility].  Ilean SkillMeghan Malikah Lakey, MSW, LCSW Clinical Social Work, Disposition  07/15/2015 [phone]

## 2015-07-15 NOTE — ED Notes (Signed)
Patient was given a ginger ale.  

## 2015-07-16 ENCOUNTER — Inpatient Hospital Stay
Admit: 2015-07-16 | Discharge: 2015-07-21 | DRG: 881 | Disposition: A | Discharge status: Home / Self Care | Payer: Medicaid Other | Source: Other Acute Inpatient Hospital | Attending: Psychiatry | Admitting: Psychiatry

## 2015-07-16 DIAGNOSIS — F1210 Cannabis abuse, uncomplicated: Secondary | ICD-10-CM

## 2015-07-16 DIAGNOSIS — F1290 Cannabis use, unspecified, uncomplicated: Secondary | ICD-10-CM | POA: Diagnosis present

## 2015-07-16 DIAGNOSIS — F4310 Post-traumatic stress disorder, unspecified: Secondary | ICD-10-CM | POA: Diagnosis present

## 2015-07-16 DIAGNOSIS — Z91410 Personal history of adult physical and sexual abuse: Secondary | ICD-10-CM

## 2015-07-16 DIAGNOSIS — F17210 Nicotine dependence, cigarettes, uncomplicated: Secondary | ICD-10-CM | POA: Diagnosis present

## 2015-07-16 DIAGNOSIS — Z818 Family history of other mental and behavioral disorders: Secondary | ICD-10-CM

## 2015-07-16 DIAGNOSIS — T426X2A Poisoning by other antiepileptic and sedative-hypnotic drugs, intentional self-harm, initial encounter: Secondary | ICD-10-CM | POA: Diagnosis present

## 2015-07-16 DIAGNOSIS — Z915 Personal history of self-harm: Secondary | ICD-10-CM | POA: Diagnosis not present

## 2015-07-16 DIAGNOSIS — F909 Attention-deficit hyperactivity disorder, unspecified type: Secondary | ICD-10-CM | POA: Diagnosis present

## 2015-07-16 DIAGNOSIS — Z87898 Personal history of other specified conditions: Secondary | ICD-10-CM

## 2015-07-16 DIAGNOSIS — J45909 Unspecified asthma, uncomplicated: Secondary | ICD-10-CM | POA: Diagnosis present

## 2015-07-16 DIAGNOSIS — F419 Anxiety disorder, unspecified: Secondary | ICD-10-CM | POA: Diagnosis present

## 2015-07-16 DIAGNOSIS — F329 Major depressive disorder, single episode, unspecified: Principal | ICD-10-CM | POA: Diagnosis present

## 2015-07-16 DIAGNOSIS — Z8669 Personal history of other diseases of the nervous system and sense organs: Secondary | ICD-10-CM

## 2015-07-16 DIAGNOSIS — F603 Borderline personality disorder: Secondary | ICD-10-CM | POA: Diagnosis present

## 2015-07-16 DIAGNOSIS — F319 Bipolar disorder, unspecified: Secondary | ICD-10-CM | POA: Diagnosis present

## 2015-07-16 DIAGNOSIS — F515 Nightmare disorder: Secondary | ICD-10-CM | POA: Diagnosis present

## 2015-07-16 DIAGNOSIS — G4700 Insomnia, unspecified: Secondary | ICD-10-CM | POA: Diagnosis present

## 2015-07-16 DIAGNOSIS — F17200 Nicotine dependence, unspecified, uncomplicated: Secondary | ICD-10-CM

## 2015-07-16 DIAGNOSIS — F331 Major depressive disorder, recurrent, moderate: Secondary | ICD-10-CM | POA: Insufficient documentation

## 2015-07-16 HISTORY — DX: Headache: R51

## 2015-07-16 HISTORY — DX: Anemia, unspecified: D64.9

## 2015-07-16 HISTORY — DX: Headache, unspecified: R51.9

## 2015-07-16 HISTORY — DX: Unspecified asthma, uncomplicated: J45.909

## 2015-07-16 HISTORY — DX: Anxiety disorder, unspecified: F41.9

## 2015-07-16 LAB — BASIC METABOLIC PANEL
ANION GAP: 7 (ref 5–15)
BUN: 9 mg/dL (ref 6–20)
CO2: 28 mmol/L (ref 22–32)
Calcium: 9.2 mg/dL (ref 8.9–10.3)
Chloride: 105 mmol/L (ref 101–111)
Creatinine, Ser: 0.66 mg/dL (ref 0.44–1.00)
GFR calc Af Amer: >60 mL/min (ref >60)
Glucose, Bld: 77 mg/dL (ref 65–99)
POTASSIUM: 3.9 mmol/L (ref 3.5–5.1)
SODIUM: 140 mmol/L (ref 135–145)

## 2015-07-16 MED ORDER — NICOTINE 21 MG/24HR TD PT24
21.0000 mg | MEDICATED_PATCH | Freq: Every day | TRANSDERMAL | Status: DC
  Filled 2015-07-16 (×2): qty 1

## 2015-07-16 MED ORDER — MAGNESIUM HYDROXIDE 400 MG/5ML PO SUSP: 30.0000 mL | Freq: Every day | ORAL | Status: DC | PRN

## 2015-07-16 MED ORDER — CLONIDINE HCL 0.1 MG PO TABS
0.1000 mg | ORAL_TABLET | Freq: Every day | ORAL | Status: DC
  Administered 2015-07-16 – 2015-07-20 (×5): 0.1 mg via ORAL
  Filled 2015-07-16 (×5): qty 1

## 2015-07-16 MED ORDER — PNEUMOCOCCAL VAC POLYVALENT 25 MCG/0.5ML IJ INJ
0.5000 mL | INJECTION | INTRAMUSCULAR | Status: AC
  Administered 2015-07-17: 0.5 mL via INTRAMUSCULAR
  Filled 2015-07-16: qty 0.5

## 2015-07-16 MED ORDER — ALUM & MAG HYDROXIDE-SIMETH 200-200-20 MG/5ML PO SUSP: 30.0000 mL | ORAL | Status: DC | PRN

## 2015-07-16 MED ORDER — ACETAMINOPHEN 325 MG PO TABS
650.0000 mg | ORAL_TABLET | Freq: Four times a day (QID) | ORAL | Status: DC | PRN
  Administered 2015-07-16: 650 mg via ORAL
  Filled 2015-07-16: qty 2

## 2015-07-16 MED ORDER — DEXMETHYLPHENIDATE HCL ER 5 MG PO CP24
15.0000 mg | ORAL_CAPSULE | Freq: Every day | ORAL | Status: DC
  Administered 2015-07-17 – 2015-07-21 (×5): 15 mg via ORAL
  Filled 2015-07-16 (×5): qty 3

## 2015-07-16 MED ORDER — INFLUENZA VAC SPLIT QUAD 0.5 ML IM SUSY
0.5000 mL | PREFILLED_SYRINGE | INTRAMUSCULAR | Status: AC
  Administered 2015-07-17: 0.5 mL via INTRAMUSCULAR
  Filled 2015-07-16: qty 0.5

## 2015-07-16 MED ORDER — DEXMETHYLPHENIDATE HCL ER 5 MG PO CP24: 15.0000 mg | ORAL_CAPSULE | Freq: Every day | ORAL | Status: DC

## 2015-07-16 MED ORDER — DEXMETHYLPHENIDATE HCL 5 MG PO TABS
10.0000 mg | ORAL_TABLET | Freq: Once | ORAL | Status: DC
  Filled 2015-07-16: qty 2

## 2015-07-16 NOTE — ED Notes (Signed)
Pt transported to [facility] by [facility] transportation.  
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
## 2015-07-16 NOTE — [facility] Group Notes (Signed)
[facility] LCSW Group Therapy   07/16/2015 1pm  Type of Therapy: Group Therapy   Participation Level: Did Not Attend. Patient invited to participate but declined.    Sherman Donaldson F. Marshea Wisher, MSW, LCSWA, LCAS    

## 2015-07-16 NOTE — Progress Notes (Signed)
Initial Nutrition Assessment      INTERVENTION:  Meals and snacks: Cater to pt preferences Medical Nutrition Supplement: If unable to meet nutritional needs will add supplement   NUTRITION DIAGNOSIS:    (none at this time) related to   as evidenced by  .    GOAL:   Patient will meet greater than or equal to 90% of their needs    MONITOR:    (Energy intake)  REASON FOR ASSESSMENT:   Malnutrition Screening Tool    ASSESSMENT:      Pt admitted with major depression disorder  Past Medical History  Diagnosis Date  . Asthma   . Anxiety   . Anemia   . Headache     Current Nutrition: eating 100% per I and O sheet  Food/Nutrition-Related History: noted per MST poor po intake prior to admission   Scheduled Medications:  . cloNIDine  0.1 mg Oral QHS  . [START ON 07/17/2015] dexmethylphenidate  15 mg Oral Q breakfast  . dexmethylphenidate  10 mg Oral Once  . [START ON 07/17/2015] Influenza vac split quadrivalent PF  0.5 mL Intramuscular Tomorrow-1000  . [START ON 07/17/2015] nicotine  21 mg Transdermal Daily  . [START ON 07/17/2015] pneumococcal 23 valent vaccine  0.5 mL Intramuscular Tomorrow-1000       Electrolyte/Renal Profile and Glucose Profile:   Recent Labs Lab 07/14/15 1910  NA 139  K 3.5  CL 107  CO2 21*  BUN <5*  CREATININE 0.68  CALCIUM 9.6  GLUCOSE 106*   Protein Profile:  Recent Labs Lab 07/14/15 1910  ALBUMIN 4.0    Gastrointestinal Profile: Last BM: WDL      Weight Change: wt trends not found in chart    Diet Order:  Diet regular Room service appropriate?: Yes; Fluid consistency:: Thin  Skin:   reviewed     Height:   Ht Readings from Last 1 Encounters:  07/16/15 5\' 7"  (1.702 m) (86 %*, Z = 1.06)   * Growth percentiles are based on CDC 2-20 Years data.    Weight:   Wt Readings from Last 1 Encounters:  07/16/15 158 lb (71.668 kg) (86 %*, Z = 1.09)   * Growth percentiles are based on CDC 2-20 Years data.     Ideal Body Weight:     BMI:  Body mass index is 24.74 kg/(m^2).   EDUCATION NEEDS:   No education needs identified at this time  LOW Care Level  Briceyda Abdullah B. Freida BusmanAllen, RD, LDN [phone] (pager) Weekend/On-Call pager [phone])

## 2015-07-16 NOTE — Tx Team (Signed)
Initial Interdisciplinary Treatment Plan   PATIENT STRESSORS: Educational concerns Financial difficulties Health problems Marital or family conflict Occupational concerns   PATIENT STRENGTHS: Ability for insight Wellsite geologistCommunication skills General fund of knowledge Motivation for treatment/growth   PROBLEM LIST: Problem List/Patient Goals Date to be addressed Date deferred Reason deferred Estimated date of resolution  Anxiety 07/16/2015     Depression 07/16/2015     Sleep Disturbance 07/16/2015     Self-Harm Behaviors  07/16/2015                                    DISCHARGE CRITERIA:  Improved stabilization in mood, thinking, and/or behavior  PRELIMINARY DISCHARGE PLAN: Outpatient therapy  PATIENT/FAMIILY INVOLVEMENT: This treatment plan has been presented to and reviewed with the patient, Emily Massey, and/or family member.  The patient and family have been given the opportunity to ask questions and make suggestions.  Lendell CapriceCasey N Naitik Hermann 07/16/2015, 3:35 AM

## 2015-07-16 NOTE — Progress Notes (Signed)
D:  Pt has been isolating in room most of the day, flat/depressed during interaction, sleeping most of day, denies SI/HI/AVH, only complaint is headache, prn pain medication given--see MAR and pain assessment flowsheet.     A:  Emotional support provided, Encouraged pt to continue with treatment plan and attend all group activities, q15 min checks maintained for safety.  R:  Pt is isolative, not going to any groups, needs encouragement to attend groups.

## 2015-07-16 NOTE — Progress Notes (Signed)
Pt states that she has not urinated all day today, bladder scan done reveals 44 mls, pt denies abdominal pain/distention, she has been drinking plenty of fluids, MD Bell notified, staff to recheck bladder scan in 2 hours and ensure pt is taking in fluids.
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
## 2015-07-16 NOTE — [facility] Group Notes (Signed)
[facility] LCSW Aftercare Discharge Planning Group Note  07/16/2015 9:15 AM  Participation Quality: Did Not Attend. Patient invited to participate but declined.   Emily Massey F. Divya Munshi, MSW, LCSWA, LCAS   
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
## 2015-07-16 NOTE — Progress Notes (Signed)
Recreation Therapy Notes  Date: 01.06.17 Time: 3:00 pm Location: Craft Room  Group Topic: Coping Skills  Goal Area(s) Addresses:  Patient will participate in healthy coping skill.  Behavioral Response: Arrived late, Attentive  Intervention: Coloring   Activity: Patients were given coloring sheets and instructed to color and think of what emotions they were feeling and what they were focused on.  Education: LRT educated patients on healthy coping skills.  Education Outcome: In group clarification offered   Clinical Observations/Feedback: Patient arrived to group at approximately 3:34 pm. LRT explained activity. Patient colored coloring sheet. Patient did not contribute to group discussion.  [name],Amand Lemoine M, LRT/CTRS 07/16/2015 4:47 PM

## 2015-07-16 NOTE — Progress Notes (Signed)
Recreation Therapy Notes  At approximately 1:31 pm, LRT attempted assessment. Patient sleeping.  [name],Gwenevere Goga M, LRT/CTRS 07/16/2015 2:17 PM

## 2015-07-17 LAB — TSH: TSH: 1.255 u[IU]/mL (ref 0.350–4.500)

## 2015-07-17 LAB — LIPID PANEL
CHOL/HDL RATIO: 2.9 ratio
CHOLESTEROL: 110 mg/dL (ref 0–200)
HDL: 38 mg/dL — AB (ref >40)
LDL Cholesterol: 62 mg/dL (ref 0–99)
TRIGLYCERIDES: 52 mg/dL (ref <150)
VLDL: 10 mg/dL (ref 0–40)

## 2015-07-17 LAB — HEMOGLOBIN A1C: HEMOGLOBIN A1C: 5.4 % (ref 4.0–6.0)

## 2015-07-17 NOTE — [facility] Group Notes (Signed)
[facility] LCSW Group Therapy  07/17/2015 1:58 PM  Type of Therapy:  Group Therapy  Participation Level:  Did Not Attend  Modes of Intervention:  Discussion, Education, Socialization and Support  Summary of Progress/Problems: Balance in life: Patients will discuss the concept of balance and how it looks and feels to be unbalanced. Pt will identify areas in their life that is unbalanced and ways to become more balanced.    Arriah Wadle L Kazuto Sevey MSW, LCSWA  07/17/2015, 1:58 PM  
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
## 2015-07-17 NOTE — Progress Notes (Signed)
D: Pt sleepy this morning, difficult to arouse. Pt did come to med room for medications. Denies SI/HI/AVH. Denies pain. Cooperative with staff.   A: Writer offered emotional support, encouraged group participation. Medications administered as ordered. Q 15 min checks continue.   R: Pt spent most of the day in her room. More alert and talkative to this writer in the afternoon. Pt also stated she is no longer having any trouble urinating.

## 2015-07-17 NOTE — Progress Notes (Signed)
D: Pt has been  flat/depressed during interaction, Minimal interaction with peers and staff., denies SI/HI/AVH. Pt had stated that she had not voided since admission. Denies pain. Observed taking in fluids. A: Emotional support provided, Encouraged pt to continue with treatment plan and attend all group activities, Q15 min checks maintained for safety. Bladder scan revealed . Abdomen assessed. No distention noted. Pt observed and no distress noted. MD notified of Pts statement regarding not being able to void.   R: Pt is isolative, minimal interaction. Med compliant. Cooperative.Will continue to monitor.

## 2015-07-17 NOTE — Progress Notes (Signed)
Pt refused information on given vaccines.

## 2015-07-18 LAB — URINALYSIS COMPLETE WITH MICROSCOPIC (ARMC ONLY)
BILIRUBIN URINE: NEGATIVE
GLUCOSE, UA: NEGATIVE mg/dL
Hgb urine dipstick: NEGATIVE
NITRITE: POSITIVE — AB
Protein, ur: 30 mg/dL — AB
SPECIFIC GRAVITY, URINE: 1.03 (ref 1.005–1.030)
pH: 6 (ref 5.0–8.0)

## 2015-07-18 MED ORDER — MIRTAZAPINE 15 MG PO TABS
7.5000 mg | ORAL_TABLET | Freq: Every day | ORAL | Status: DC
  Administered 2015-07-18: 7.5 mg via ORAL
  Filled 2015-07-18 (×2): qty 1
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
## 2015-07-18 NOTE — Plan of Care (Signed)
Problem: Ineffective individual coping Goal: STG: Pt will be able to identify effective and ineffective STG: Pt will be able to identify effective and ineffective coping patterns  Outcome: Progressing Pt identified reading and listening to music as effective coping skills to use when feelings stressed.

## 2015-07-18 NOTE — [facility] Group Notes (Signed)
[facility] LCSW Group Therapy  07/18/2015 2:26 PM  Type of Therapy:  Group Therapy  Participation Level:  Did Not Attend  Modes of Intervention:  Discussion, Education, Socialization and Support  Summary of Progress/Problems: Todays topic: Grudges  Patients will be encouraged to discuss their thoughts, feelings, and behaviors as to why one holds on to grudges and reasons why people have grudges. Patients will process the impact of grudges on their daily lives and identify thoughts and feelings related to holding grudges. Patients will identify feelings and thoughts related to what life would look like without grudges.   Shaquela Weichert L Natally Ribera MSW, LCSWA  07/18/2015, 2:26 PM   

## 2015-07-18 NOTE — Progress Notes (Signed)
D: Pt reading a book upon approach. Pt denies SI/HI/AVH and pain. Minimal eye contact. Not forwarding much information. Pt did speak briefly about her living situation, relationship with grandmother is "ify".    A: Writer offered emotional support. Encouraged participation in groups. Administered medications as ordered. Q 15 min checks continue.   R: Pt spend majority of day isolated in room. Cooperative, pleasant with staff. Compliant with medications.

## 2015-07-18 NOTE — Progress Notes (Signed)
D: Pt guarded when asked for self inventory. Pt denies SI/HI/AVH and pain. Minimal eye contact. Not forwarding much information. Denies pain.  A: Writer offered emotional support. Encouraged participation in groups. Administered medications as ordered. Q 15 min checks continue for safety  R: Pt visible in milieu and attended evening group.  Cooperative, pleasant with staff. Compliant with medications.
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
## 2015-07-18 NOTE — Progress Notes (Signed)
D: Observed pt in room reading a book. Patient alert and oriented x4. Patient denies SI/HI/AVH. Pt affect is flat. Pt described reading 7 chapters today. Pt seen on phone multiple times during the evening. Pt talked at length with writer about current family stressors that seemed to revolve around living with her grandmother and finances. Pt described grandmother as being supportive but also as a key source of stress in her life. Pt described a hx of hearing voices, but currently denies hearing them. Pt endorsed that the Gabapentin overdose was "Just wanting to go to sleep..I value my life too much to want to kill myself." Pt indicated she does not want to attend groups as there is nothing new she can get out of them. Pt pleasant and cooperative. Pt able to logically identify stressors in her life.  A: Offered active listening and support. Provided therapeutic communication. Administered scheduled medications. Encouraged pt to attend groups, as there might be something new she could learn. Encouraged pt to discuss family stressors with Child psychotherapistsocial worker and psychiatrist.  R: Pt does not appear amenable to attend group. Pt supportive of including grandmother in treatment process. Pt remains cooperative and med compliant. Will continue Q15 min. Checks. Safety maintained.

## 2015-07-19 MED ORDER — MIRTAZAPINE 15 MG PO TABS
15.0000 mg | ORAL_TABLET | Freq: Every day | ORAL | Status: DC
  Administered 2015-07-19 – 2015-07-20 (×2): 15 mg via ORAL
  Filled 2015-07-19 (×3): qty 1

## 2015-07-19 MED FILL — Dexmethylphenidate HCl Cap ER 24 HR 15 MG: ORAL | Qty: 1 | Status: AC

## 2015-07-19 NOTE — [facility] Group Notes (Signed)
[facility] Group Notes:  (Nursing/MHT/Case Management/Adjunct)  Date:  07/19/2015  Time:  12:10 PM  Type of Therapy:  Psychoeducational Skills  Participation Level:  Did Not Attend  [place]quette Oldmanda Lea Campbell 07/19/2015, 12:10 PM

## 2015-07-19 NOTE — Progress Notes (Signed)
Recreation Therapy Notes  INPATIENT RECREATION THERAPY ASSESSMENT  Patient Details Name: Emily Massey MRN: [id number] DOB: 07-27-1995 Today's Date: 07/19/2015  Patient Stressors: Family, Other (Comment) (Has a non-existent toxic relationship with mother; finances)  Coping Skills:   Isolate, Substance Abuse, Avoidance, Exercise, Art/Dance, Music, Sports, Other (Comment) (Reading, writing poetry, movies, knitting, puzzles)  Personal Challenges: Anger, Communication, Decision-Making, Concentration, Expressing Yourself, Problem-Solving, Relationships, Self-Esteem/Confidence, Social Interaction, Stress Management, Substance Abuse, Trusting Others  Leisure Interests (2+):  Individual - Other (Comment) (Talk on the phone with her girlfriend, make people laugh)  Awareness of Community Resources:  Yes  Community Resources:  [employer], Other (Comment) [employer]([facility])  Current Use: Yes  If no, Barriers?:    Patient Strengths:  Sense of humor, personality  Patient Identified Areas of Improvement:  Letting people in when she is stressed or overwhelmed, giving people the benfit of the doubt and trusting them  Current Recreation Participation:  Listening to music, writing poetry  Patient Goal for Hospitalization:  To step outside of her comfort zone more  [place]  [place]   Current [place] (including self-harm):  No  Current HI:  No  Consent to Intern Participation: N/A   [name],[name], LRT/CTRS 07/19/2015, 2:47 PM
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
## 2015-07-19 NOTE — Progress Notes (Signed)
Patient denies SI/HI/AVH, no needs voiced, no distress. Patient has depressed mood, visible on the milieu. Support and encouragement offered. Safety maintained.

## 2015-07-19 NOTE — Progress Notes (Signed)
Recreation Therapy Notes  Date: 01.09.17 Time: 3:00 pm Location: Craft Room  Group Topic: Self-expression  Goal Area(s) Addresses:  Patient will identify one color per emotion listed on wheel. Patient will verbalize benefit of using art as a means of self-expression. Patient will verbalize one emotion experienced during session. Patient will be educated on other forms of self-expression.  Behavioral Response: Attentive, Interactive  Intervention: Emotion Wheel  Activity: Patients were given a worksheet with 7 emotions on it and instructed to pick a color for each emotion.  Education: LRT educated patients on different forms of self-expression.  Education Outcome: Acknowledges education/In group clarification offered   Clinical Observations/Feedback: Patient completed activity by picking a color for each emotion. Patient contributed to group discussion by stating some of the colors she picked and why she picked them.  [name],Lasandra Batley M, LRT/CTRS 07/19/2015 4:30 PM

## 2015-07-19 NOTE — [facility] Group Notes (Signed)
[facility]  07/19/2015 10:58 AM  Type of Therapy:  Group Therapy  Participation Level:  Did Not Attend    Jake SharkLaws, Ersel Wadleigh PMSW, LCSW 07/19/2015, 10:58 AM

## 2015-07-19 NOTE — [facility] Suicide Risk Assessment (Signed)
[facility] INPATIENT:  Family/Significant Other Suicide Prevention Education  Suicide Prevention Education:  Education Completed; pt's grandmother Herbert PunDebra McLwain at ph: [phone],  (name of family member/significant other) has been identified by the patient as the family member/significant other with whom the patient will be residing, and identified as the person(s) who will aid the patient in the event of a mental health crisis (suicidal ideations/suicide attempt).  With written consent from the patient, the family member/significant other has been provided the following suicide prevention education, prior to the and/or following the discharge of the patient.  The suicide prevention education provided includes the following:  Suicide risk factors  Suicide prevention and interventions  National Suicide Hotline telephone number  [facility] assessment telephone number  [facility] Emergency Assistance 911  [facility] and/or Residential Mobile Crisis Unit telephone number  Request made of family/significant other to:  Remove weapons (e.g., guns, rifles, knives), all items previously/currently identified as safety concern.    Remove drugs/medications (over-the-counter, prescriptions, illicit drugs), all items previously/currently identified as a safety concern.  The family member/significant other verbalizes understanding of the suicide prevention education information provided.  The family member/significant other agrees to remove the items of safety concern listed above.  Dorothe PeaJonathan F Tajai Suder 07/19/2015, 10:43 AM

## 2015-07-19 NOTE — Progress Notes (Signed)
D: Patient denies SI/HI/AVH. Patient rates hopelessness and depression as 0.  Patient affect and mood are flat and depressed.  Pt states that she slept good and her appetite is fair.  Pt states that her energy level is low and her ability to pay attention is good. Patient did not attend morning group. Patient visible on the milieu. No distress noted. A: Support and encouragement offered. Scheduled medications given to pt. Q 15 min checks continued for patient safety. R: Patient receptive. Patient remains safe on the unit.

## 2015-07-19 NOTE — [facility] Group Notes (Signed)
[facility] Group Notes:  (Nursing/MHT/Case Management/Adjunct)  Date:  07/19/2015  Time:  3:27 AM  Type of Therapy:  Group Therapy  Participation Level:  Active  Participation Quality:  Appropriate  Affect:  Appropriate  Cognitive:  Appropriate  Insight:  Good  Engagement in Group:  Engaged  Modes of Intervention:  n/a  Summary of Progress/Problems:  Emily Massey Emily Massey Emily Massey 07/19/2015, 3:27 AM

## 2015-07-20 MED ORDER — CLONIDINE HCL 0.1 MG PO TABS: 0.1000 mg | ORAL_TABLET | Freq: Every day | ORAL | Status: DC

## 2015-07-20 MED ORDER — MIRTAZAPINE 15 MG PO TABS: 15.0000 mg | ORAL_TABLET | Freq: Every day | ORAL | Status: DC

## 2015-07-20 MED ORDER — DEXMETHYLPHENIDATE HCL ER 15 MG PO CP24: 15.0000 mg | ORAL_CAPSULE | Freq: Every day | ORAL | Status: DC

## 2015-07-20 MED FILL — Dexmethylphenidate HCl Cap ER 24 HR 15 MG: ORAL | Qty: 1 | Status: AC
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
## 2015-07-20 NOTE — Progress Notes (Signed)
Pt took medications without incident. No other needs noted at that time.

## 2015-07-20 NOTE — [facility] Suicide Risk Assessment (Signed)
[facility]   Demographic Factors:  Adolescent or young adult, Emily Massey, lesbian, or bisexual orientation and Unemployed  Total Time spent with patient: 30 minutes    Psychiatric Specialty Exam: Physical Exam  ROS  Blood pressure 82/45, pulse 57, temperature 97.5 F (36.4 C), temperature source Oral, resp. rate 18, height 5\' 7"  (1.702 m), weight 71.668 kg (158 lb), last menstrual period 07/07/2015, SpO2 100 %.Body mass index is 24.74 kg/(m^2).  Have you used any form of tobacco in the last 30 days? (Cigarettes, Smokeless Tobacco, Cigars, and/or Pipes): Yes  Has this patient used any form of tobacco in the last 30 days? (Cigarettes, Smokeless Tobacco, Cigars, and/or Pipes) Yes, A prescription for an FDA-approved tobacco cessation medication was offered at discharge and the patient refused  Mental Status Per Nursing Assessment::   On Admission:  NA  Current Mental Status by Physician: Emily Massey, future oriented mood euthymic affect much brighter and reactive. Participated in some programming. Compliant with medications. Denies any behaviors during her stay in the unit. No agitation. No evidence of psychosis, mania or hypomania. Patient denies SI, HI  Loss Factors: Relational problems with mother  Historical Factors: Prior suicide attempts, Family history of mental illness or substance abuse and Impulsivity  Risk Reduction Factors:   Sense of responsibility to family, Living with another person, especially a relative and Positive social support  Continued Clinical Symptoms:  Alcohol/Substance Abuse/Dependencies Personality Disorders:   Cluster B Comorbid depression Previous Psychiatric Diagnoses and Treatments  Cognitive Features That Contribute To Risk:  None    Suicide Risk:  Minimal: No identifiable suicidal ideation.  Patients presenting with no risk factors but with morbid ruminations; may be classified as minimal risk based on the severity of the  depressive symptoms  Principal Problem: MDD (major depressive disorder) [facility](HCC) Discharge Diagnoses:  Patient Active Problem List   Diagnosis Date Noted  . Attention deficit hyperactivity disorder (ADHD) [F90.9] 07/16/2015  . Tobacco use disorder [F17.200] 07/16/2015  . Borderline personality disorder [F60.3] 07/16/2015  . PTSD (post-traumatic stress disorder) [F43.10] 07/16/2015  . Asthma [J45.909] 07/16/2015  . Cannabis use disorder, mild, abuse [F12.10] 07/16/2015  . 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 Moderate episode of recurrent major depressive disorder ([facility]) [F33.1]   . MDD (major depressive disorder) ([facility]) [F32.9] 07/15/2015    Follow-up Information    Follow up with [facility].   Why:  Please arrive on Friday January 27th at 12:45pm for your hospital follow up appointment and for therapy and medication management. Please call to reschedule if you can't make the appointment.     Contact information:   [address] [place] [address] Ph: [phone]      Is patient on multiple antipsychotic therapies at discharge:  No   Has Patient had three or more failed trials of antipsychotic monotherapy by history:  No  Recommended Plan for Multiple Antipsychotic Therapies: NA    Jimmy FootmanHernandez-Gonzalez,  Medardo Hassing 07/21/2015, 9:07 AM

## 2015-07-20 NOTE — [facility] Group Notes (Signed)
[facility] Group Notes:  (Nursing/MHT/Case Management/Adjunct)  Date:  07/20/2015  Time:  4:54 PM  Type of Therapy:  Psychoeducational Skills  Participation Level:  Did Not Attend   Emily Massey 07/20/2015, 4:54 PM 

## 2015-07-20 NOTE — Progress Notes (Signed)
Recreation Therapy Notes  Date: 01.10.17 Time: 3:00 pm Location: Craft Room  Group Topic: Goal Setting  Goal Area(s) Addresses:  Patient will write down one goal. Patient will write at least one encouraging statement.  Behavioral Response: Did not attend  Intervention: Step By Step  Activity: Patients were given a foot and instructed to write a goal towards their recovery on the inside of the foot and write positive statements on the outside.  Education: LRT educated patients on ways they can stay focused on their goals.  Education Outcome: Patient did not attend group.  Clinical Observations/Feedback: Patient did not attend group.  [name],Haakon Titsworth M, LRT/CTRS 07/20/2015 4:12 PM

## 2015-07-20 NOTE — Progress Notes (Signed)
D: Patient refused this am group therapy . Patient remained in her her bed through out am shift. Refused nicotine patch. Out for  Meals . Patient stated slept poor last night .Stated appetite isfair and energy level  Is normal. Stated concentration is good . Stated on Depression scale  0, hopeless 0 and anxiety 0 .( low 0-10 high) Denies suicidal  homicidal ideations  .  No auditory hallucinations  No pain concerns . Appropriate ADL'S. Interacting with peers and staff. Informed of discharge tomorrow A: Encourage patient participation with unit programming . Instruction  Given on  Medication , verbalize understanding. R: Voice no other concerns. Staff continue to monitor
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
## 2015-07-20 NOTE — Plan of Care (Signed)
[facility]: [facility] Participation in Recreation Therapeutic Interventions Goal: STG-Patient will identify at least five coping skills for ** STG: Coping Skills - Within 4 treatment sessions, patient will verbalize at least 5 coping skills for substance abuse in each of 2 treatment sessions to decrease substance abuse post d/c.  Outcome: Progressing Treatment Session 1; Completed 1 out of 2: At approximately 11:15 am, LRT met with patient in patient room. Patient verbalized 5 coping skills for substance abuse. LRT educated patient on leisure and why it is important to implement it into his schedule. LRT educated and provided patient with blank schedules to help him plan his day and try to avoid using substances. LRT educated patient on healthy support systems. Intervention Used: Coping Skills worksheet  Leon[facility], LRT/CTRS 01.10.17 4:29 pm Goal: STG-Other Recreation Therapy Goal (Specify) STG: Stress Management - Within 4 treatment sessions, patient will verbalize understanding of the stress management techniques in each of 2 treatment sessions to increase stress management skills post d/c.  Outcome: Progressing Treatment Session 1; Completed 1 out of 2: At approximately 11:15 am, LRT met with patient in patient room. LRT educated and provided patient with handouts on stress management techniques. Patient verbalized understanding. LRT encouraged patient to read over and practice the stress management techniques. Intervention Used: Stress Management handouts  [facility], LRT/CTRS 01.10.17 4:30 pm  Problem: [facility] Participation in Recreation Therapeutic Interventions Goal: STG-Patient will identify at least five coping skills for ** STG: Coping Skills - Within 4 treatment sessions, patient will verbalize at least 5 coping skills for anger in each of 2 treatment sessions to increase anger management skills post d/c.  Outcome: Progressing Treatment Session 1; Completed 1 out of 2: At  approximately 11:15 am, LRT met with patient in patient room. Patient verbalized 5 coping skills for anger. Patient verbalized what triggers her to get angry, how her body responds to anger, and how she is going to remember to use her coping skills. LRT provided suggestions as well. Intervention Used: Coping Skills worksheet  [facility], LRT/CTRS 01.10.17 4:35 pm

## 2015-07-20 NOTE — Plan of Care (Signed)
Problem: Ineffective individual coping Goal: LTG: Patient will report a decrease in negative feelings Outcome: Progressing Working on coping skills     

## 2015-07-21 MED FILL — Dexmethylphenidate HCl Cap ER 24 HR 15 MG: ORAL | Qty: 1 | Status: AC
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
## 2015-07-21 NOTE — Plan of Care (Signed)
Problem: Alteration in mood; excessive anxiety as evidenced by: Goal: STG-Pt will report an absence of self-harm thoughts/actions (Patient will report an absence of self-harm thoughts or actions)  Outcome: Progressing Patient denies SI at this time.     

## 2015-07-21 NOTE — Progress Notes (Signed)
  [facility] Dis[facility] :  Will you be returning to the same living situation after discharge:  Yes,  pt will be returning home to live with her grandmother At discharge, do you have transportation home?: Yes,  pt will be picked up by her grandmother Do you have the ability to pay for your medications: Yes,  pt will be provided with medications at discharge  Release of information consent forms completed and in the chart;  Patient's signature needed at discharge.  Patient to Follow up at: Follow-up Information    Follow up with [facility].   Why:  Please arrive on Friday January 27th at 12:45pm for your hospital follow up appointment and for therapy and medication management. Please call to reschedule if you can't make the appointment.     Contact information:   [address] [place] [address] Ph: [phone]      Next level of care provider [facility] Link:no  Safety Planning and Suicide Prevention discussed: Yes,  with pt  Have you used any form of tobacco in the last 30 days? (Cigarettes, Smokeless Tobacco, Cigars, and/or Pipes): Yes  Has patient been referred to the Quitline?: Patient refused referral  Patient has been referred for addiction treatment: Pt. refused referral  Dorothe PeaJonathan F Evalin Shawhan 07/21/2015, 11:12 AM

## 2015-07-21 NOTE — Progress Notes (Signed)
Recreation Therapy Notes  INPATIENT RECREATION TR PLAN  Patient Details Name: Amabel Stmarie MRN: [id number] DOB: 1996-06-08 Today's Date: 07/21/2015  Rec Therapy Plan Is patient appropriate for Therapeutic Recreation?: Yes Treatment times per week: At least once a week TR Treatment/Interventions: 1:1 session, Group participation (Comment) (Appropriate participation in daily recreation therapy tx)  Discharge Criteria Pt will be discharged from therapy if:: Treatment goals are met, Discharged Treatment plan/goals/alternatives discussed and agreed upon by:: Patient/family  Discharge Summary Short term goals set: See Care Plan Short term goals met: Complete Progress toward goals comments: One-to-one attended Which groups?: Coping skills, Other (Comment) (Self-expression) One-to-one attended: Stress management, anger management, coping skills Reason goals not met: N/A Therapeutic equipment acquired: None Reason patient discharged from therapy: Discharge from hospital Pt/family agrees with progress & goals achieved: Yes Date patient discharged from therapy: 07/21/15   [facility], LRT/CTRS 07/21/2015, 6:19 PM

## 2015-07-21 NOTE — Plan of Care (Signed)
Problem: [facility] Participation in Recreation Therapeutic Interventions Goal: STG-Patient will identify at least five coping skills for ** STG: Coping Skills - Within 4 treatment sessions, patient will verbalize at least 5 coping skills for substance abuse in each of 2 treatment sessions to decrease substance abuse post d/c.  Outcome: Completed/Met Date Met:  07/21/15 Treatment Session 2; Completed 2 out of 2: At approximately 11:15 am, LRT met with patient in patient room. Patient verbalized 5 coping skills for substance abuse. LRT encouraged patient to participate in leisure activities. Intervention Used: Coping Skills worksheet  Leonette Monarch, LRT/CTRS 01.11.17 5:03 pm Goal: STG-Other Recreation Therapy Goal (Specify) STG: Stress Management - Within 4 treatment sessions, patient will verbalize understanding of the stress management techniques in each of 2 treatment sessions to increase stress management skills post d/c.  Outcome: Completed/Met Date Met:  07/21/15 Treatment Session 2; Completed 2 out of 2: At approximately 11:15 am, LRT met with patient in patient room. Patient reported she read over and practiced the stress management techniques. Patient verbalized understanding and reported they were helpful. LRT encouraged patient to continue practicing the stress management techniques. Intervention Used: Stress Management handouts  Leonette Monarch, LRT/CTRS 01.11.17 5:05 pm  Problem: [facility] Participation in Recreation Therapeutic Interventions Goal: STG-Patient will identify at least five coping skills for ** STG: Coping Skills - Within 4 treatment sessions, patient will verbalize at least 5 coping skills for anger in each of 2 treatment sessions to increase anger management skills post d/c.  Outcome: Completed/Met Date Met:  07/21/15 Treatment Session 2; Completed 2 out of 2: At approximately 11:15 am, LRT met with patient in patient room. Patient verbalized 5 coping skills for anger.  LRT encouraged patient to use coping skills when she felt herself getting angry to help calm herself down. Intervention Used: Coping Skills worksheet  Leonette Monarch, LRT/CTRS 01.11.17 5:06 pm

## 2015-07-21 NOTE — Progress Notes (Signed)
D: Discharge plans discussed with patient. Patient states she's ready to get home. She denies SI/HI/AVH. She also denies pain. She was seen in the milieu.  A: Medication was given with education. Encouragement was provided.  R: Patient was compliant with medication. Patient remains cooperative. Safety maintained with 15 min checks.

## 2015-07-21 NOTE — Progress Notes (Signed)
Recreation Therapy Notes  Date: 01.11.17 Time: 3:00 pm Location: Craft Room  Group Topic: Self-esteem  Goal Area(s) Addresses:  Patient will write at least one positive trait. Patient will verbalize benefit of having a healthy self-esteem.  Behavioral Response: Did not attend  Intervention: I Am  Activity: Patients were given a worksheet with the letter I and instructed to write as many positive traits inside the letter as they could.  Education: LRT educated patients on ways they can increase their self-esteem.  Education Outcome: Patient did not attend group.  Clinical Observations/Feedback: Patient did not attend group.  [name],Steffon Gladu M, LRT/CTRS 07/21/2015 4:38 PM

## 2015-07-21 NOTE — [facility] Group Notes (Signed)
[facility] LCSW Aftercare Discharge Planning Group Note   07/21/2015 4:40 PM  Participation Quality:  Did not attend   Filicia Scogin L Cohan Stipes MSW, LCSWA  

## 2015-07-21 NOTE — Progress Notes (Signed)
D: Patient stated slept good last night .Stated appetite is good and energy level  Is normal. Stated concentration is good . Stated on Depression scale , hopeless and anxiety .( low 0-10 high) Denies suicidal  homicidal ideations  .  No auditory hallucinations  No pain concerns . Appropriate ADL'S. Interacting with peers and staff.  A: Encourage patient participation with unit programming . Instruction  Given on  Medication , verbalize understanding. R: Voice no other concerns. Staff continue to monitor  

## 2015-07-21 NOTE — Progress Notes (Signed)
D:Patient aware of discharge this shift . Patient returning home . Patient received all belonging locked up . Patient denies  Suicidal  And homicidal ideations  .  A: Writer instructed on discharge criteria  .  Prescriptions  given to patient f . Aware  Of follow up appointment . R: Patient left unit with no questions  [place] [facility] [place] [facility] [place]

## 2015-07-22 ENCOUNTER — Encounter (HOSPITAL_COMMUNITY): Payer: Self-pay | Admitting: *Deleted

## 2015-08-16 ENCOUNTER — Emergency Department (HOSPITAL_COMMUNITY)
Admission: EM | Admit: 2015-08-16 | Discharge: 2015-08-16 | Disposition: A | Discharge status: Home / Self Care | Payer: Medicaid - Out of State | Attending: Emergency Medicine | Admitting: Emergency Medicine

## 2015-08-16 ENCOUNTER — Encounter (HOSPITAL_COMMUNITY): Payer: Self-pay

## 2015-08-16 DIAGNOSIS — J45909 Unspecified asthma, uncomplicated: Secondary | ICD-10-CM | POA: Insufficient documentation

## 2015-08-16 DIAGNOSIS — N390 Urinary tract infection, site not specified: Secondary | ICD-10-CM | POA: Insufficient documentation

## 2015-08-16 DIAGNOSIS — Z3202 Encounter for pregnancy test, result negative: Secondary | ICD-10-CM | POA: Diagnosis not present

## 2015-08-16 DIAGNOSIS — F17210 Nicotine dependence, cigarettes, uncomplicated: Secondary | ICD-10-CM | POA: Diagnosis not present

## 2015-08-16 DIAGNOSIS — Z79899 Other long term (current) drug therapy: Secondary | ICD-10-CM | POA: Diagnosis not present

## 2015-08-16 DIAGNOSIS — Z862 Personal history of diseases of the blood and blood-forming organs and certain disorders involving the immune mechanism: Secondary | ICD-10-CM | POA: Diagnosis not present

## 2015-08-16 DIAGNOSIS — R109 Unspecified abdominal pain: Secondary | ICD-10-CM | POA: Diagnosis present

## 2015-08-16 DIAGNOSIS — F419 Anxiety disorder, unspecified: Secondary | ICD-10-CM | POA: Insufficient documentation

## 2015-08-16 DIAGNOSIS — F329 Major depressive disorder, single episode, unspecified: Secondary | ICD-10-CM | POA: Insufficient documentation

## 2015-08-16 LAB — URINE MICROSCOPIC-ADD ON

## 2015-08-16 LAB — CBC
HCT: 39.6 % (ref 36.0–46.0)
Hemoglobin: 13.2 g/dL (ref 12.0–15.0)
MCH: 28.6 pg (ref 26.0–34.0)
MCHC: 33.3 g/dL (ref 30.0–36.0)
MCV: 85.7 fL (ref 78.0–100.0)
PLATELETS: 285 10*3/uL (ref 150–400)
RBC: 4.62 MIL/uL (ref 3.87–5.11)
RDW: 13.7 % (ref 11.5–15.5)
WBC: 4.1 10*3/uL (ref 4.0–10.5)

## 2015-08-16 LAB — COMPREHENSIVE METABOLIC PANEL
ALBUMIN: 4.2 g/dL (ref 3.5–5.0)
ALK PHOS: 75 U/L (ref 38–126)
ALT: 12 U/L — ABNORMAL LOW (ref 14–54)
AST: 21 U/L (ref 15–41)
Anion gap: 14 (ref 5–15)
BILIRUBIN TOTAL: 0.9 mg/dL (ref 0.3–1.2)
BUN: 12 mg/dL (ref 6–20)
CALCIUM: 9.4 mg/dL (ref 8.9–10.3)
CO2: 24 mmol/L (ref 22–32)
CREATININE: 0.85 mg/dL (ref 0.44–1.00)
Chloride: 102 mmol/L (ref 101–111)
GFR calc Af Amer: >60 mL/min (ref >60)
GLUCOSE: 84 mg/dL (ref 65–99)
POTASSIUM: 3.7 mmol/L (ref 3.5–5.1)
Sodium: 140 mmol/L (ref 135–145)
TOTAL PROTEIN: 7.6 g/dL (ref 6.5–8.1)

## 2015-08-16 LAB — I-STAT BETA HCG BLOOD, ED (MC, WL, AP ONLY)

## 2015-08-16 LAB — LIPASE, BLOOD: Lipase: 21 U/L (ref 11–51)

## 2015-08-16 LAB — URINALYSIS, ROUTINE W REFLEX MICROSCOPIC
Glucose, UA: NEGATIVE mg/dL
KETONES UR: 15 mg/dL — AB
NITRITE: POSITIVE — AB
PH: 6 (ref 5.0–8.0)
PROTEIN: NEGATIVE mg/dL
Specific Gravity, Urine: 1.028 (ref 1.005–1.030)

## 2015-08-16 MED ORDER — CEPHALEXIN 500 MG PO CAPS: 500.0000 mg | ORAL_CAPSULE | Freq: Two times a day (BID) | ORAL | Status: DC

## 2015-08-16 MED ORDER — ACETAMINOPHEN 325 MG PO TABS
650.0000 mg | ORAL_TABLET | Freq: Once | ORAL | Status: AC
  Administered 2015-08-16: 650 mg via ORAL
  Filled 2015-08-16: qty 2

## 2015-08-16 MED ORDER — CEPHALEXIN 250 MG PO CAPS
500.0000 mg | ORAL_CAPSULE | Freq: Once | ORAL | Status: AC
  Administered 2015-08-16: 500 mg via ORAL
  Filled 2015-08-16: qty 2

## 2015-08-16 NOTE — Discharge Instructions (Signed)
Urinary Tract Infection Return for back pain, fever, or no relief of symptoms. Follow-up with women's outpatient clinic. Take antibiotics as prescribed. Drink plenty of fluids. Urinary tract infections (UTIs) can develop anywhere along your urinary tract. Your urinary tract is your body's drainage system for removing wastes and extra water. Your urinary tract includes two kidneys, two ureters, a bladder, and a urethra. Your kidneys are a pair of bean-shaped organs. Each kidney is about the size of your fist. They are located below your ribs, one on each side of your spine. CAUSES Infections are caused by microbes, which are microscopic organisms, including fungi, viruses, and bacteria. These organisms are so small that they can only be seen through a microscope. Bacteria are the microbes that most commonly cause UTIs. SYMPTOMS  Symptoms of UTIs may vary by age and gender of the patient and by the location of the infection. Symptoms in young women typically include a frequent and intense urge to urinate and a painful, burning feeling in the bladder or urethra during urination. Older women and men are more likely to be tired, shaky, and weak and have muscle aches and abdominal pain. A fever may mean the infection is in your kidneys. Other symptoms of a kidney infection include pain in your back or sides below the ribs, nausea, and vomiting. DIAGNOSIS To diagnose a UTI, your caregiver will ask you about your symptoms. Your caregiver will also ask you to provide a urine sample. The urine sample will be tested for bacteria and white blood cells. White blood cells are made by your body to help fight infection. TREATMENT  Typically, UTIs can be treated with medication. Because most UTIs are caused by a bacterial infection, they usually can be treated with the use of antibiotics. The choice of antibiotic and length of treatment depend on your symptoms and the type of bacteria causing your infection. HOME CARE  INSTRUCTIONS  If you were prescribed antibiotics, take them exactly as your caregiver instructs you. Finish the medication even if you feel better after you have only taken some of the medication.  Drink enough water and fluids to keep your urine clear or pale yellow.  Avoid caffeine, tea, and carbonated beverages. They tend to irritate your bladder.  Empty your bladder often. Avoid holding urine for long periods of time.  Empty your bladder before and after sexual intercourse.  After a bowel movement, women should cleanse from front to back. Use each tissue only once. SEEK MEDICAL CARE IF:   You have back pain.  You develop a fever.  Your symptoms do not begin to resolve within 3 days. SEEK IMMEDIATE MEDICAL CARE IF:   You have severe back pain or lower abdominal pain.  You develop chills.  You have nausea or vomiting.  You have continued burning or discomfort with urination. MAKE SURE YOU:   Understand these instructions.  Will watch your condition.  Will get help right away if you are not doing well or get worse.   This information is not intended to replace advice given to you by your health care provider. Make sure you discuss any questions you have with your health care provider.   Document Released: 04/05/2005 Document Revised: 03/17/2015 Document Reviewed: 08/04/2011 Elsevier Interactive Patient Education [employer]

## 2015-08-16 NOTE — ED Provider Notes (Signed)
CSN: [id number]     Arrival date & time 08/16/15  1707 History  By signing my name below, I, Elon Spanner, attest that this documentation has been prepared under the direction and in the presence of [employer], PA-C. Electronically Signed: Elon Spanner ED Scribe. 08/16/2015. 7:56 PM.    Chief Complaint  Patient presents with  . Abdominal Pain   The history is provided by the patient. No language interpreter was used.   HPI Comments: Emily Massey is a 20 y.o. female with hx of asthma, PTSD, MDD who presents to the Emergency Department complaining of 8/10, sharp/throbbing, constant abdominal pain, worse laterally, onset 3 weeks ago "that is now channeling to my back and legs".  Associated symptoms include nausea.  She denies previous episodes of similar pain and says "I mean I've had stomach pains before and had ovarian cysts but this isn't like that."  The patient reports 1 sexual partner in the last 6 months and denies hx of STD.  No tx's tried since onset.  LNMP 08/13/14.  She denies diarrhea, constipation, fever, dysuria, vaginal discharge, vaginal bleeding, vaginal itching, vaginal odor, recent self-harm.    Past Medical History  Diagnosis Date  . Mental disorder   . Depression   . Seizures (HCC)   . Asthma   . Anxiety   . Anemia   . Headache    Past Surgical History  Procedure Laterality Date  . Foreign body removal Right 03/16/2013    Procedure: REMOVAL FOREIGN BODY EXTREMITY;  Surgeon: Sharma Covert, MD;  Location: [facility] OR;  Service: Orthopedics;  Laterality: Right;  . Hand surgery    . External ear surgery     No family history on file. Social History  Substance Use Topics  . Smoking status: Current Every Day Smoker -- 1.00 packs/day for 1 years    Types: Cigarettes  . Smokeless tobacco: None  . Alcohol Use: No   OB History    Gravida Para Term Preterm AB TAB SAB Ectopic Multiple Living   0 0 0 0 0 0 0 0       Review of Systems  Constitutional: Negative for  fever.  Gastrointestinal: Positive for nausea and abdominal pain. Negative for vomiting, diarrhea and constipation.  Genitourinary: Negative for dysuria, vaginal bleeding and vaginal discharge.  All other systems reviewed and are negative.     Allergies  Bee pollen-1000-royal jelly and Bee venom  Home Medications   Prior to Admission medications   Medication Sig Start Date End Date Taking? Authorizing Provider  albuterol (PROVENTIL HFA;VENTOLIN HFA) 108 (90 BASE) MCG/ACT inhaler Inhale 2 puffs into the lungs every 6 (six) hours as needed for wheezing. For wheezing 03/07/13   Jolene Schimke, NP  cephALEXin (KEFLEX) 500 MG capsule Take 1 capsule (500 mg total) by mouth 2 (two) times daily. 08/16/15   Jeriah Skufca Patel-Mills, PA-C  cloNIDine (CATAPRES) 0.1 MG tablet Take 0.05-0.1 mg by mouth 3 (three) times daily. Take 1 tablet every morning and every evening and take 1/2 tablet at lunch 03/07/13   Jolene Schimke, NP  cloNIDine (CATAPRES) 0.1 MG tablet Take 1 tablet (0.1 mg total) by mouth at bedtime. 07/20/15   Jimmy Footman, MD  dexmethylphenidate (FOCALIN XR) 15 MG 24 hr capsule Take 1 capsule (15 mg total) by mouth daily with breakfast. 07/20/15   Jimmy Footman, MD  divalproex (DEPAKOTE) 500 MG DR tablet Take 500 mg by mouth 3 (three) times daily.    Historical Provider, MD  lurasidone (LATUDA) 40 MG TABS tablet Take 40 mg by mouth daily with breakfast.    Historical Provider, MD  mirtazapine (REMERON) 15 MG tablet Take 1 tablet (15 mg total) by mouth at bedtime. 07/20/15   Jimmy Footman, MD   BP 114/66 mmHg  Pulse 73  Temp(Src) 98 F (36.7 C) (Oral)  Resp 18  SpO2 100%  LMP 08/13/2015 Physical Exam  Constitutional: She is oriented to person, place, and time. She appears well-developed and well-nourished. No distress.  HENT:  Head: Normocephalic and atraumatic.  Eyes: Conjunctivae and EOM are normal.  Neck: Normal range of motion. Neck supple. No tracheal  deviation present.  Cardiovascular: Normal rate, regular rhythm and normal heart sounds.   No murmur heard. Regular rate and rhythm. No murmur.  Pulmonary/Chest: Effort normal and breath sounds normal. No respiratory distress. She has no wheezes.  Lungs CTA bilaterally.   Abdominal: Soft. She exhibits no distension. There is no rebound and no guarding.    Left-sided CVA tenderness.  Bilateral abdominal side pain.  No guarding or rebound.  No abdominal distention.     Musculoskeletal: Normal range of motion.  Neurological: She is alert and oriented to person, place, and time.  Skin: Skin is warm and dry.  Old cutting marks along right forearm.  Psychiatric: She has a normal mood and affect. Her behavior is normal.  Nursing note and vitals reviewed.   ED Course  Procedures (including critical care time)  DIAGNOSTIC STUDIES: Oxygen Saturation is 100% on RA, normal by my interpretation.    COORDINATION OF CARE:  8:07 PM patient asked to give urine sample.  Patient acknowledges and agrees with plan.    Labs Review Labs Reviewed  COMPREHENSIVE METABOLIC PANEL - Abnormal; Notable for the following:    ALT 12 (*)    All other components within normal limits  URINALYSIS, ROUTINE W REFLEX MICROSCOPIC (NOT AT [facility]) - Abnormal; Notable for the following:    APPearance CLOUDY (*)    Hgb urine dipstick MODERATE (*)    Bilirubin Urine SMALL (*)    Ketones, ur 15 (*)    Nitrite POSITIVE (*)    Leukocytes, UA MODERATE (*)    All other components within normal limits  URINE MICROSCOPIC-ADD ON - Abnormal; Notable for the following:    Squamous Epithelial / LPF 0-5 (*)    Bacteria, UA MANY (*)    All other components within normal limits  URINE CULTURE  LIPASE, BLOOD  CBC  I-STAT BETA HCG BLOOD, ED ([facility], [facility], [facility] ONLY)    Imaging Review No results found. I have personally reviewed and evaluated these lab results as part of my medical decision-making.   EKG Interpretation None       MDM   Final diagnoses:  UTI (lower urinary tract infection)   Pt diagnosed with a UTI. I do not suspect pyelonephritis. Pt is afebrile and without tachycardia, hypotension, or other signs of serious infection.  Pt to be dc home with Keflex and instructions to follow up with PCP if symptoms persist. Urine culture pending.  Discussed return precautions. Pt appears safe for discharge. Filed Vitals:   08/16/15 1957 08/16/15 2115  BP: 121/73 114/66  Pulse: 74 73  Temp: 97.8 F (36.6 C) 98 F (36.7 C)  Resp: 16 18   Medications  acetaminophen (TYLENOL) tablet 650 mg (650 mg Oral Given 08/16/15 2046)  cephALEXin (KEFLEX) capsule 500 mg (500 mg Oral Given 08/16/15 2108)   I personally performed the services  described in this documentation, which was scribed in my presence. The recorded information has been reviewed and is accurate.   HannCatha Gosselin PA-C 08/16/15 2338  Laurence Spates, MD 08/17/15 [phone]

## 2015-08-16 NOTE — ED Notes (Signed)
Patient states that she is unable to give urine sample at this time. 

## 2015-08-16 NOTE — ED Notes (Signed)
Pt here with c/o generalized abdominal pain and nausea onset 3 weeks ago. She decided to come into today because the pain has not went away. Pt denies N/D and denies vaginal bleeding or discharge. LMP-08/13/15

## 2015-08-18 LAB — URINE CULTURE
Culture: >=100000
Special Requests: NORMAL

## 2015-08-19 ENCOUNTER — Telehealth (HOSPITAL_COMMUNITY): Payer: Self-pay

## 2015-08-19 NOTE — Telephone Encounter (Signed)
Post ED Visit - Positive Culture Follow-up  Culture report reviewed by antimicrobial stewardship pharmacist:   Enzo Bi, Pharm.D.  Celedonio Miyamoto, Pharm.D., BCPS  Garvin Fila, Pharm.D.  Georgina Pillion, Pharm.D., BCPS  [place], [address].D., BCPS, AAHIVP  Estella Husk, Pharm.D., BCPS, AAHIVP  Tennis Must, [address].D.  Sherle Poe, [place].D.  Positive urine culture>/= 100,000 colonies -> E Coli Treated with Cephalexin, organism sensitive to the same and no further patient follow-up is required at this time  Arvid Right 08/19/2015, 2:09 PM

## 2015-10-22 IMAGING — US US ART/VEN ABD/PELV/SCROTUM DOPPLER LTD
1 series · 14 of 25 positions shown · non-contrast
Comparison: None.

CLINICAL DATA: Pelvic pain

EXAM:
TRANSABDOMINAL ULTRASOUND OF PELVIS
DOPPLER ULTRASOUND OF OVARIES
TECHNIQUE: Transabdominal ultrasound examination of the pelvis was performed
including evaluation of the uterus, ovaries, adnexal regions, and
pelvic cul-de-sac.
Color and duplex Doppler ultrasound was utilized to evaluate blood
flow to the ovaries.

[Series 1: us art/ven abd/pelv/scrotum doppler ltd · 0.21mm/px · 14 of 48 slices shown]
[im 1/48]
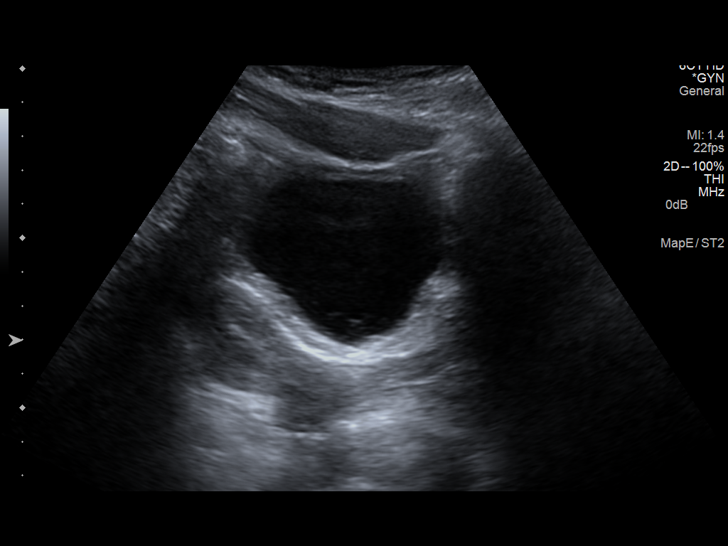
[im 4/48]
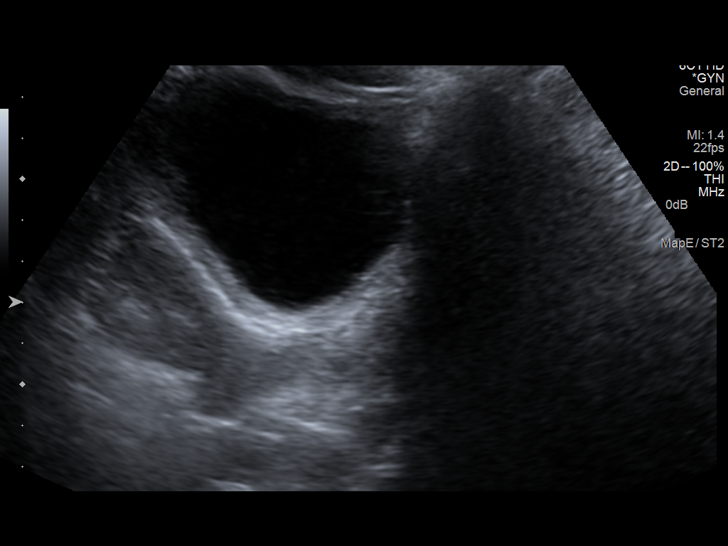
[im 8/48]
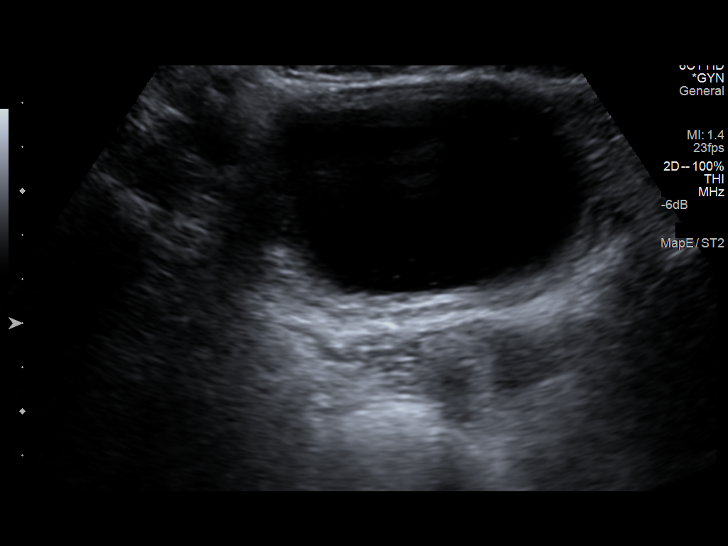
[im 12/48]
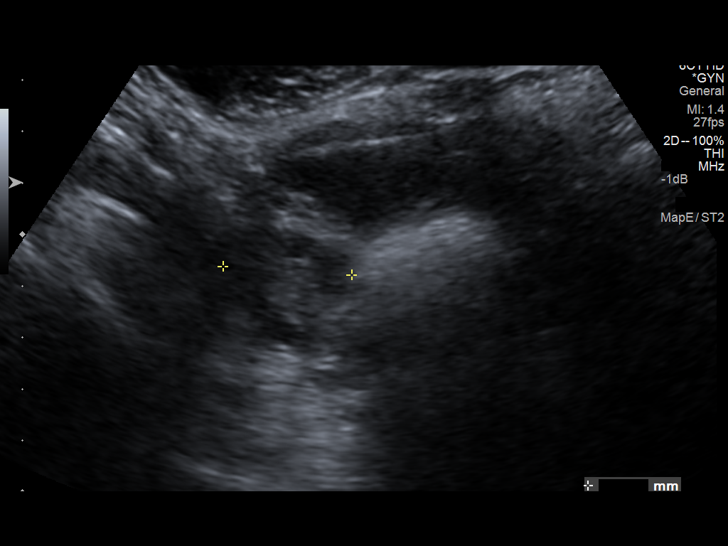
[im 16/48]
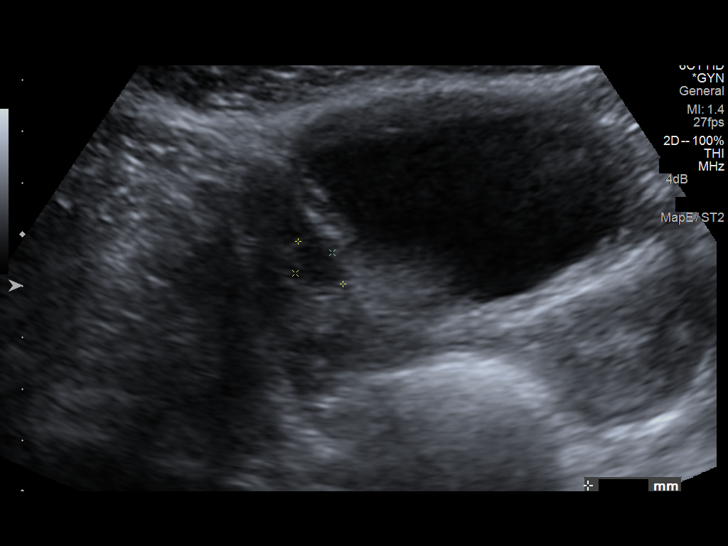
[im 18/48]
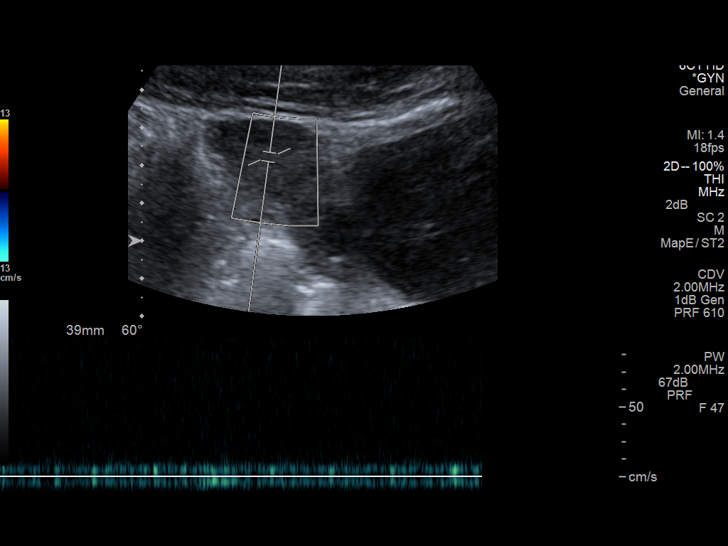
[im 22/48]
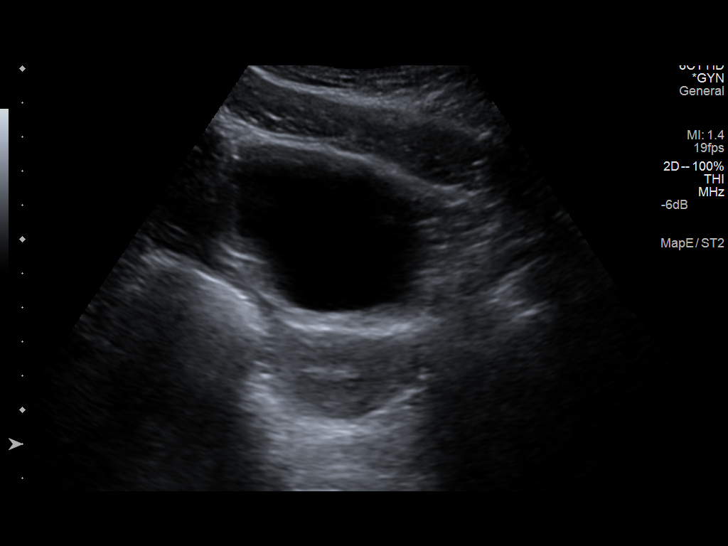
[im 26/48]
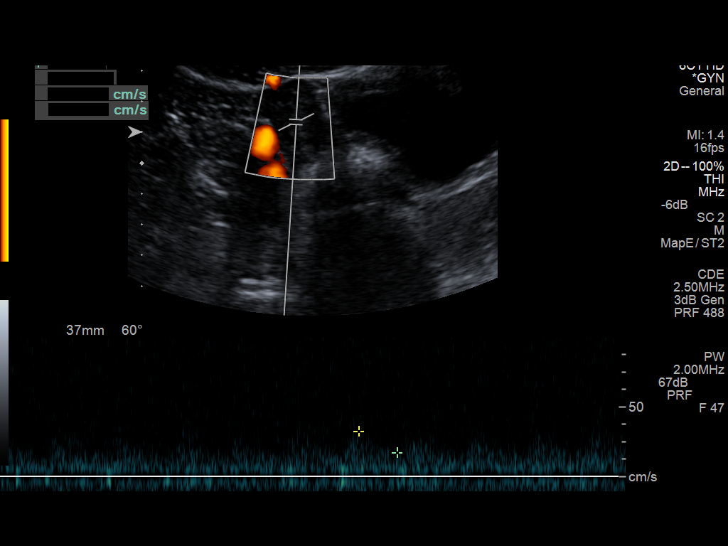
[im 30/48]
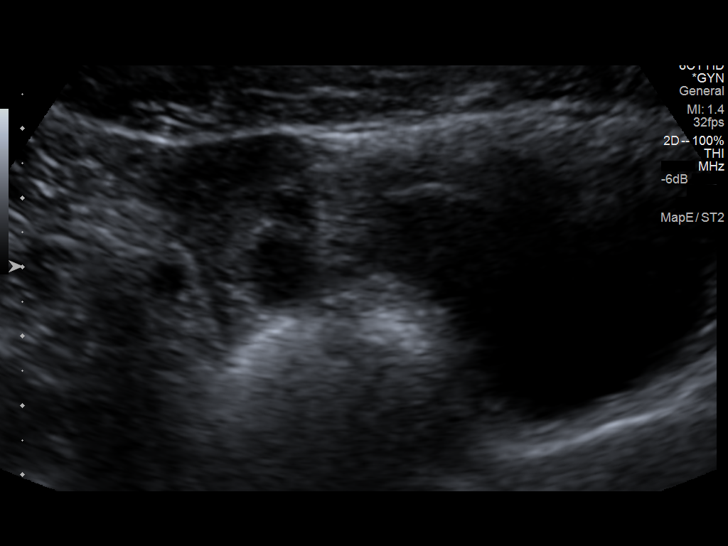
[im 32/48]
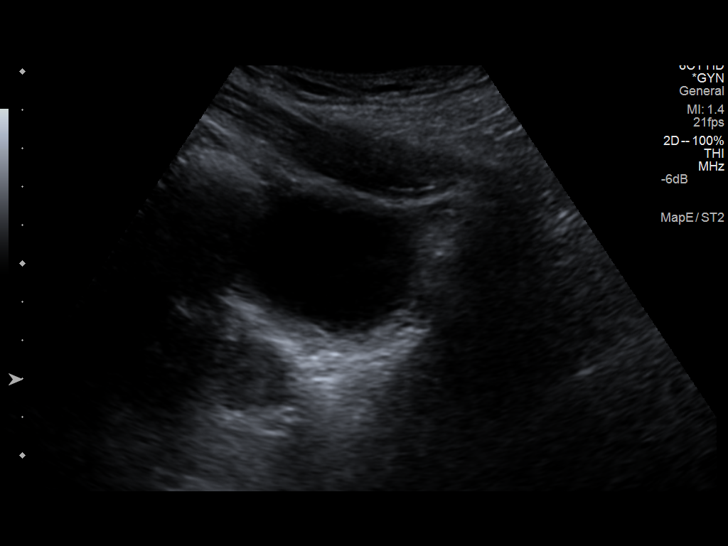
[im 36/48]
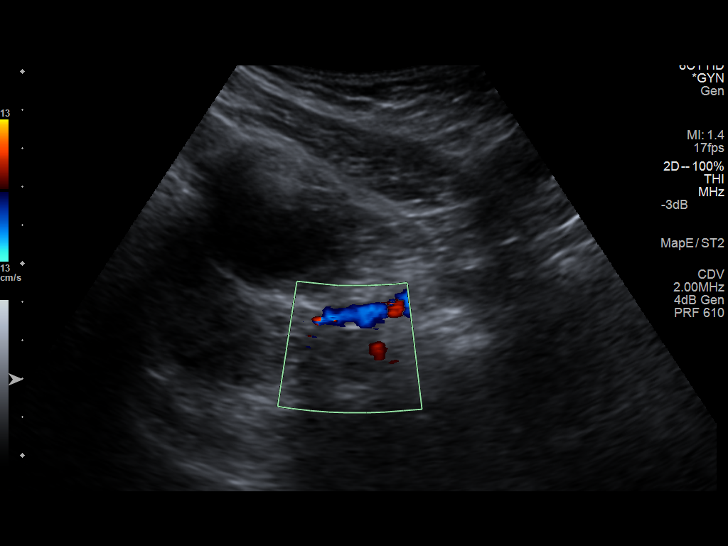
[im 40/48]
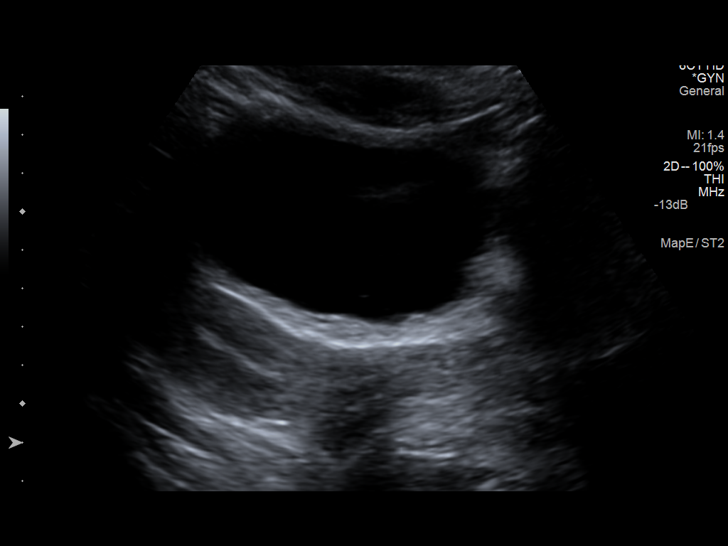
[im 44/48]
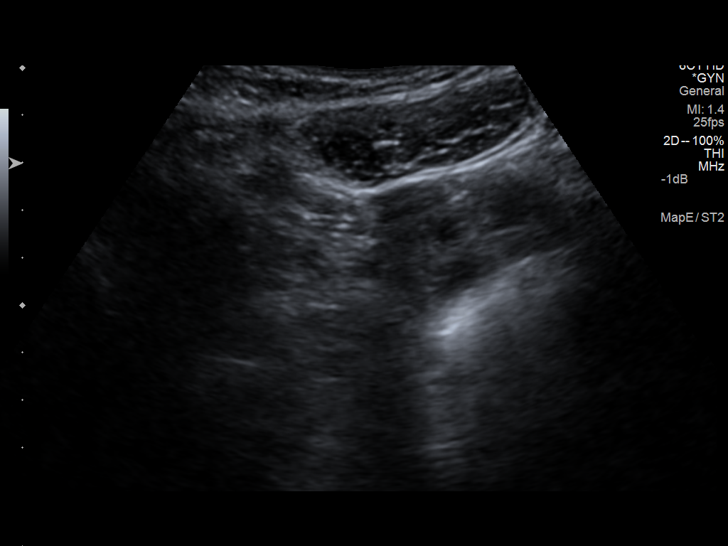
[im 48/48]
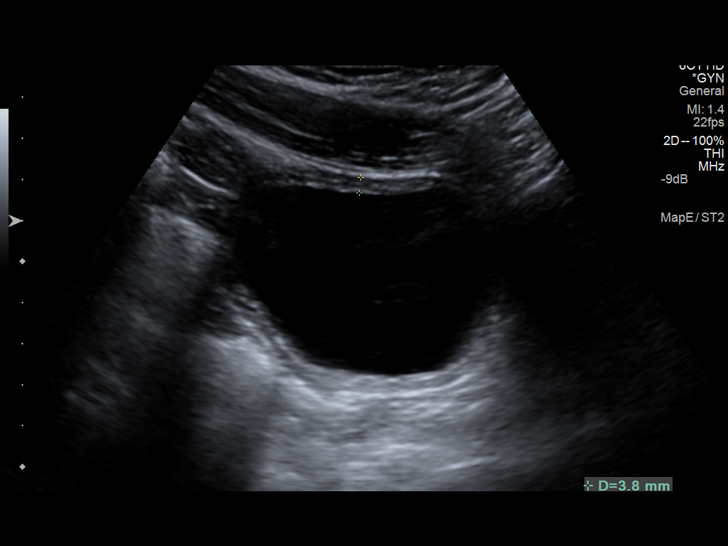

[14 of 25 positions shown; findings below may reference images not displayed]

FINDINGS: Uterus

Measurements: 5 x 2.6 x 3.8 cm. No fibroids or other mass
visualized.

Endometrium

Thickness: 4.9 mm  No focal abnormality visualized.

Right ovary

Measurements: 33 x 17 x 25 mm.  Several small follicles. No mass.

Left ovary

Measurements: 24 x 17 x 21 mm Normal appearance/no adnexal mass.

Pulsed Doppler evaluation demonstrates normal low-resistance
arterial and venous waveforms in both ovaries. Normal color Doppler
signal. There is only trace of free pelvic fluid. Bladder wall
appears mildly thickened but is incompletely distended.
IMPRESSION: 1. Negative for torsion, mass, or other acute abnormality.

## 2016-09-21 ENCOUNTER — Emergency Department (HOSPITAL_COMMUNITY)
Admission: EM | Admit: 2016-09-21 | Discharge: 2016-09-21 | Disposition: A | Discharge status: Home / Self Care | Payer: Medicaid Other | Attending: Emergency Medicine | Admitting: Emergency Medicine

## 2016-09-21 ENCOUNTER — Emergency Department (HOSPITAL_COMMUNITY): Payer: Medicaid Other

## 2016-09-21 ENCOUNTER — Encounter (HOSPITAL_COMMUNITY): Payer: Self-pay | Admitting: Family Medicine

## 2016-09-21 DIAGNOSIS — K5909 Other constipation: Secondary | ICD-10-CM | POA: Insufficient documentation

## 2016-09-21 DIAGNOSIS — F909 Attention-deficit hyperactivity disorder, unspecified type: Secondary | ICD-10-CM | POA: Diagnosis not present

## 2016-09-21 DIAGNOSIS — R1084 Generalized abdominal pain: Secondary | ICD-10-CM

## 2016-09-21 DIAGNOSIS — F17210 Nicotine dependence, cigarettes, uncomplicated: Secondary | ICD-10-CM | POA: Insufficient documentation

## 2016-09-21 DIAGNOSIS — R072 Precordial pain: Secondary | ICD-10-CM | POA: Diagnosis present

## 2016-09-21 DIAGNOSIS — J45909 Unspecified asthma, uncomplicated: Secondary | ICD-10-CM | POA: Insufficient documentation

## 2016-09-21 DIAGNOSIS — R0789 Other chest pain: Secondary | ICD-10-CM

## 2016-09-21 DIAGNOSIS — Z79899 Other long term (current) drug therapy: Secondary | ICD-10-CM | POA: Insufficient documentation

## 2016-09-21 LAB — I-STAT TROPONIN, ED: TROPONIN I, POC: 0 ng/mL (ref 0.00–0.08)

## 2016-09-21 LAB — CBC WITH DIFFERENTIAL/PLATELET
Basophils Absolute: 0 10*3/uL (ref 0.0–0.1)
Basophils Relative: 1 %
EOS ABS: 0 10*3/uL (ref 0.0–0.7)
EOS PCT: 1 %
HCT: 38.2 % (ref 36.0–46.0)
Hemoglobin: 12.7 g/dL (ref 12.0–15.0)
LYMPHS ABS: 2.8 10*3/uL (ref 0.7–4.0)
Lymphocytes Relative: 62 %
MCH: 28.7 pg (ref 26.0–34.0)
MCHC: 33.2 g/dL (ref 30.0–36.0)
MCV: 86.4 fL (ref 78.0–100.0)
MONOS PCT: 3 %
Monocytes Absolute: 0.2 10*3/uL (ref 0.1–1.0)
Neutro Abs: 1.4 10*3/uL — ABNORMAL LOW (ref 1.7–7.7)
Neutrophils Relative %: 33 %
PLATELETS: 299 10*3/uL (ref 150–400)
RBC: 4.42 MIL/uL (ref 3.87–5.11)
RDW: 13.1 % (ref 11.5–15.5)
WBC: 4.4 10*3/uL (ref 4.0–10.5)

## 2016-09-21 LAB — URINALYSIS, ROUTINE W REFLEX MICROSCOPIC
Bilirubin Urine: NEGATIVE
Glucose, UA: NEGATIVE mg/dL
HGB URINE DIPSTICK: NEGATIVE
Ketones, ur: NEGATIVE mg/dL
Leukocytes, UA: NEGATIVE
NITRITE: NEGATIVE
PROTEIN: NEGATIVE mg/dL
Specific Gravity, Urine: 1.023 (ref 1.005–1.030)
pH: 5 (ref 5.0–8.0)

## 2016-09-21 LAB — RAPID URINE DRUG SCREEN, HOSP PERFORMED
Amphetamines: NOT DETECTED
Barbiturates: NOT DETECTED
Benzodiazepines: NOT DETECTED
Cocaine: NOT DETECTED
Opiates: NOT DETECTED
Tetrahydrocannabinol: NOT DETECTED

## 2016-09-21 LAB — COMPREHENSIVE METABOLIC PANEL
ALK PHOS: 57 U/L (ref 38–126)
ALT: 11 U/L — AB (ref 14–54)
AST: 15 U/L (ref 15–41)
Albumin: 3.6 g/dL (ref 3.5–5.0)
Anion gap: 9 (ref 5–15)
BILIRUBIN TOTAL: 0.5 mg/dL (ref 0.3–1.2)
BUN: 5 mg/dL — ABNORMAL LOW (ref 6–20)
CALCIUM: 8.9 mg/dL (ref 8.9–10.3)
CO2: 27 mmol/L (ref 22–32)
CREATININE: 0.83 mg/dL (ref 0.44–1.00)
Chloride: 103 mmol/L (ref 101–111)
GFR calc non Af Amer: >60 mL/min (ref >60)
GLUCOSE: 87 mg/dL (ref 65–99)
Potassium: 3.8 mmol/L (ref 3.5–5.1)
SODIUM: 139 mmol/L (ref 135–145)
Total Protein: 6.4 g/dL — ABNORMAL LOW (ref 6.5–8.1)

## 2016-09-21 LAB — ETHANOL

## 2016-09-21 LAB — PREGNANCY, URINE: PREG TEST UR: NEGATIVE

## 2016-09-21 LAB — LIPASE, BLOOD: Lipase: 13 U/L (ref 11–51)

## 2016-09-21 MED ORDER — POLYETHYLENE GLYCOL 3350 17 G PO PACK: 17.0000 g | PACK | Freq: Every day | ORAL | 1 refills | Status: AC

## 2016-09-21 MED ORDER — ONDANSETRON HCL 4 MG/2ML IJ SOLN
4.0000 mg | Freq: Once | INTRAMUSCULAR | Status: AC
  Administered 2016-09-21: 4 mg via INTRAVENOUS
  Filled 2016-09-21: qty 2

## 2016-09-21 MED ORDER — IOPAMIDOL (ISOVUE-300) INJECTION 61%
INTRAVENOUS | Status: AC
  Administered 2016-09-21: 100 mL
  Filled 2016-09-21: qty 100

## 2016-09-21 MED ORDER — NAPROXEN 500 MG PO TABS: 500.0000 mg | ORAL_TABLET | Freq: Two times a day (BID) | ORAL | 0 refills | Status: DC

## 2016-09-21 MED ORDER — SODIUM CHLORIDE 0.9 % IV SOLN
INTRAVENOUS | Status: DC
  Administered 2016-09-21: 11:00:00 via INTRAVENOUS

## 2016-09-21 MED ORDER — SODIUM CHLORIDE 0.9 % IV BOLUS (SEPSIS)
1000.0000 mL | Freq: Once | INTRAVENOUS | Status: AC
  Administered 2016-09-21: 1000 mL via INTRAVENOUS

## 2016-09-21 NOTE — ED Notes (Signed)
Pt comfortable with discharge and follow up instructions. Pt declines wheelchair, escorted to waiting area by this RN. Rx x2 

## 2016-09-21 NOTE — Discharge Instructions (Signed)
Workup for the chest pain abdominal pain with evidence of constipation only. Social worker also arrange for safe shelter. Take the MiraLAX as directed for the next 7 days.
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
## 2016-09-21 NOTE — ED Provider Notes (Signed)
[facility]-EMERGENCY DEPT Provider Note   CSN: [id number] Arrival date & time: 09/21/16  4098     History   Chief Complaint Chief Complaint  Patient presents with  . Depression  . Chest Pain    HPI Emily Massey is a 21 y.o. female.  Patient with several complaints today. Main complaint was substernal chest tenderness has been ongoing for one and a half weeks. Patient also states she's been having some abdominal discomfort for about a month. With occasional nausea and vomiting. No diarrhea. Patient denies shortness of breath denies fevers. Other concern is that the home environment is unsafe. Patient states she's not suicidal but if she has to go back to the home she will be suicidal.  Chest pain substernal in nature made worse by some movement of the left arm. Also made worse by pressing on her chest.      Past Medical History:  Diagnosis Date  . Anemia   . Anxiety   . Asthma   . Depression   . Headache   . Mental disorder   . Seizures [facility])     Patient Active Problem List   Diagnosis Date Noted  . Attention deficit hyperactivity disorder (ADHD) 07/16/2015  . Tobacco use disorder 07/16/2015  . Borderline personality disorder 07/16/2015  . PTSD (post-traumatic stress disorder) 07/16/2015  . Asthma 07/16/2015  . Cannabis use disorder, mild, abuse 07/16/2015  . History of pseudoseizure 07/16/2015  . Moderate episode of recurrent major depressive disorder (HCC)   . MDD (major depressive disorder) 07/15/2015  . 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 Injury, self-inflicted 03/15/2013  . MDD (major depressive disorder), recurrent, severe, with psychosis (HCC) 03/04/2013  . Post traumatic stress disorder (PTSD) 03/04/2013    Past Surgical History:  Procedure Laterality Date  . EXTERNAL EAR SURGERY    . FOREIGN BODY REMOVAL Right 03/16/2013   Procedure: REMOVAL FOREIGN BODY EXTREMITY;  Surgeon: Sharma Covert, MD;  Location: [facility] OR;   Service: Orthopedics;  Laterality: Right;  . HAND SURGERY      OB History    Gravida Para Term Preterm AB Living   0 0 0 0 0     SAB TAB Ectopic Multiple Live Births   0 0 0           Home Medications    Prior to Admission medications   Medication Sig Start Date End Date Taking? Authorizing Provider  naproxen sodium (ANAPROX) 220 MG tablet Take 440 mg by mouth 2 (two) times daily as needed (for pain).   Yes Historical Provider, MD  albuterol (PROVENTIL HFA;VENTOLIN HFA) 108 (90 BASE) MCG/ACT inhaler Inhale 2 puffs into the lungs every 6 (six) hours as needed for wheezing.  03/07/13   Jolene Schimke, NP  cephALEXin (KEFLEX) 500 MG capsule Take 1 capsule (500 mg total) by mouth 2 (two) times daily. Patient not taking: Reported on 09/21/2016 08/16/15   Catha Gosselin, PA-C  cloNIDine (CATAPRES) 0.1 MG tablet Take 1 tablet (0.1 mg total) by mouth at bedtime. Patient not taking: Reported on 09/21/2016 07/20/15   Jimmy Footman, MD  dexmethylphenidate (FOCALIN XR) 15 MG 24 hr capsule Take 1 capsule (15 mg total) by mouth daily with breakfast. Patient not taking: Reported on 09/21/2016 07/20/15   Jimmy Footman, MD  mirtazapine (REMERON) 15 MG tablet Take 1 tablet (15 mg total) by mouth at bedtime. Patient not taking: Reported on 09/21/2016 07/20/15   Jimmy Footman, MD    Family History No  family history on file.  Social History Social History  Substance Use Topics  . Smoking status: Current Every Day Smoker    Packs/day: 1.00    Years: 1.00    Types: Cigarettes  . Smokeless tobacco: Never Used  . Alcohol use No     Allergies   Bee pollen-1000-royal jelly [nutritional supplements]; Bee venom; and Mushroom extract complex   Review of Systems Review of Systems  Constitutional: Negative for fever.  HENT: Negative for congestion.   Eyes: Negative for visual disturbance.  Respiratory: Negative for shortness of breath.   Cardiovascular: Positive for  chest pain.  Gastrointestinal: Positive for abdominal pain, nausea and vomiting.  Genitourinary: Negative for difficulty urinating.  Musculoskeletal: Negative for back pain.  Skin: Negative for rash.  Neurological: Negative for headaches.  Hematological: Does not bruise/bleed easily.  Psychiatric/Behavioral: Negative for confusion.     Physical Exam Updated Vital Signs BP 101/71   Pulse 67   Temp 97.8 F (36.6 C) (Oral)   Resp 14   Ht 5\' 6"  (1.676 m)   Wt 73 kg   LMP 09/18/2016 (Exact Date)   SpO2 99%   BMI 25.99 kg/m   Physical Exam  Constitutional: She is oriented to person, place, and time. She appears well-developed and well-nourished. No distress.  HENT:  Head: Normocephalic and atraumatic.  Mouth/Throat: Oropharynx is clear and moist.  Eyes: EOM are normal. Pupils are equal, round, and reactive to light.  Neck: Normal range of motion. Neck supple.  Cardiovascular: Normal rate and regular rhythm.   Pulmonary/Chest: Effort normal and breath sounds normal. She exhibits tenderness.  Abdominal: Soft. Bowel sounds are normal. There is no tenderness.  Musculoskeletal: Normal range of motion. She exhibits no edema.  Neurological: She is alert and oriented to person, place, and time. No cranial nerve deficit or sensory deficit. She exhibits normal muscle tone. Coordination normal.  Skin: Skin is warm. No rash noted.  Nursing note and vitals reviewed.    ED Treatments / Results  Labs (all labs ordered are listed, but only abnormal results are displayed) Labs Reviewed  COMPREHENSIVE METABOLIC PANEL - Abnormal; Notable for the following:       Result Value   BUN 5 (*)    Total Protein 6.4 (*)    ALT 11 (*)    All other components within normal limits  CBC WITH DIFFERENTIAL/PLATELET - Abnormal; Notable for the following:    Neutro Abs 1.4 (*)    All other components within normal limits  URINALYSIS, ROUTINE W REFLEX MICROSCOPIC - Abnormal; Notable for the following:      APPearance CLOUDY (*)    All other components within normal limits  LIPASE, BLOOD  ETHANOL  RAPID URINE DRUG SCREEN, HOSP PERFORMED  PREGNANCY, URINE  I-STAT TROPOININ, ED   Results for orders placed or performed during the hospital encounter of 09/21/16  Comprehensive metabolic panel  Result Value Ref Range   Sodium 139 135 - 145 mmol/L   Potassium 3.8 3.5 - 5.1 mmol/L   Chloride 103 101 - 111 mmol/L   CO2 27 22 - 32 mmol/L   Glucose, Bld 87 65 - 99 mg/dL   BUN 5 (L) 6 - 20 mg/dL   Creatinine, Ser 4.25 0.44 - 1.00 mg/dL   Calcium 8.9 8.9 - 95.6 mg/dL   Total Protein 6.4 (L) 6.5 - 8.1 g/dL   Albumin 3.6 3.5 - 5.0 g/dL   AST 15 15 - 41 U/L   ALT 11 (L) 14 -  54 U/L   Alkaline Phosphatase 57 38 - 126 U/L   Total Bilirubin 0.5 0.3 - 1.2 mg/dL   GFR calc non Af Amer >60 >60 mL/min   GFR calc Af Amer >60 >60 mL/min   Anion gap 9 5 - 15  Lipase, blood  Result Value Ref Range   Lipase 13 11 - 51 U/L  CBC with Differential/Platelet  Result Value Ref Range   WBC 4.4 4.0 - 10.5 K/uL   RBC 4.42 3.87 - 5.11 MIL/uL   Hemoglobin 12.7 12.0 - 15.0 g/dL   HCT 16.138.2 09.636.0 - 04.546.0 %   MCV 86.4 78.0 - 100.0 fL   MCH 28.7 26.0 - 34.0 pg   MCHC 33.2 30.0 - 36.0 g/dL   RDW 40.913.1 81.111.5 - 91.415.5 %   Platelets 299 150 - 400 K/uL   Neutrophils Relative % 33 %   Neutro Abs 1.4 (L) 1.7 - 7.7 K/uL   Lymphocytes Relative 62 %   Lymphs Abs 2.8 0.7 - 4.0 K/uL   Monocytes Relative 3 %   Monocytes Absolute 0.2 0.1 - 1.0 K/uL   Eosinophils Relative 1 %   Eosinophils Absolute 0.0 0.0 - 0.7 K/uL   Basophils Relative 1 %   Basophils Absolute 0.0 0.0 - 0.1 K/uL  Ethanol  Result Value Ref Range   Alcohol, Ethyl (B) <5 <5 mg/dL  Rapid urine drug screen (hospital performed)  Result Value Ref Range   Opiates NONE DETECTED NONE DETECTED   Cocaine NONE DETECTED NONE DETECTED   Benzodiazepines NONE DETECTED NONE DETECTED   Amphetamines NONE DETECTED NONE DETECTED   Tetrahydrocannabinol NONE DETECTED NONE  DETECTED   Barbiturates NONE DETECTED NONE DETECTED  Pregnancy, urine  Result Value Ref Range   Preg Test, Ur NEGATIVE NEGATIVE  Urinalysis, Routine w reflex microscopic  Result Value Ref Range   Color, Urine YELLOW YELLOW   APPearance CLOUDY (A) CLEAR   Specific Gravity, Urine 1.023 1.005 - 1.030   pH 5.0 5.0 - 8.0   Glucose, UA NEGATIVE NEGATIVE mg/dL   Hgb urine dipstick NEGATIVE NEGATIVE   Bilirubin Urine NEGATIVE NEGATIVE   Ketones, ur NEGATIVE NEGATIVE mg/dL   Protein, ur NEGATIVE NEGATIVE mg/dL   Nitrite NEGATIVE NEGATIVE   Leukocytes, UA NEGATIVE NEGATIVE  I-stat troponin, ED  Result Value Ref Range   Troponin i, poc 0.00 0.00 - 0.08 ng/mL   Comment 3            EKG  EKG Interpretation  Date/Time:  Thursday September 21 2016 08:21:05 EDT Ventricular Rate:  73 PR Interval:    QRS Duration: 72 QT Interval:  337 QTC Calculation: 372 R Axis:   72 Text Interpretation:  Sinus rhythm Confirmed by Deretha EmoryZACKOWSKI  MD, Brithney Bensen ([id number]) on 09/21/2016 8:25:52 AM       Radiology Dg Chest 2 View  Result Date: 09/21/2016 CLINICAL DATA:  Chest pain for the past 10 days. History of asthma, major depression and PTSD, current smoker. EXAM: CHEST  2 VIEW COMPARISON:  None in PACs FINDINGS: The lungs are adequately inflated and clear. The heart and pulmonary vascularity are normal. The mediastinum is normal in width. The trachea is midline. The bony thorax exhibits no acute abnormality. IMPRESSION: There is no active cardiopulmonary disease. Electronically Signed   By: David  SwazilandJordan M.D.   On: 09/21/2016 09:54   Ct Abdomen Pelvis W Contrast  Result Date: 09/21/2016 CLINICAL DATA:  Right lower quadrant pain radiating to left side. EXAM: CT ABDOMEN AND  PELVIS WITH CONTRAST TECHNIQUE: Multidetector CT imaging of the abdomen and pelvis was performed using the standard protocol following bolus administration of intravenous contrast. CONTRAST:  ISOVUE-300 IOPAMIDOL (ISOVUE-300) INJECTION 61%  COMPARISON:  Pelvic ultrasound 10/29/2013 FINDINGS: Lower chest: Lung bases are clear. No effusions. Heart is normal size. Hepatobiliary: Small hypodensity in the left hepatic lobe, likely small cysts. Gallbladder unremarkable. Pancreas: No focal abnormality or ductal dilatation. Spleen: No focal abnormality.  Normal size. Adrenals/Urinary Tract: Small hypodensity in the midpole of the right kidney compatible with small cysts. No hydronephrosis. Adrenal glands and urinary bladder are unremarkable. Stomach/Bowel: Large stool burden throughout the colon. Normal appendix. Stomach and small bowel decompressed, grossly unremarkable. Vascular/Lymphatic: No evidence of aneurysm or adenopathy. Reproductive: Uterus and adnexa unremarkable.  No mass. Other: No free fluid or free air. Musculoskeletal: No acute bony abnormality. IMPRESSION: Normal appendix. No acute findings in the abdomen or pelvis. Large stool burden throughout the colon. Electronically Signed   By: Charlett Nose M.D.   On: 09/21/2016 11:04    Procedures Procedures (including critical care time)  Medications Ordered in ED Medications  0.9 %  sodium chloride infusion ( Intravenous New Bag/Given 09/21/16 1035)  sodium chloride 0.9 % bolus 1,000 mL (0 mLs Intravenous Stopped 09/21/16 1156)  ondansetron (ZOFRAN) injection 4 mg (4 mg Intravenous Given 09/21/16 1036)  iopamidol (ISOVUE-300) 61 % injection (100 mLs  Contrast Given 09/21/16 1051)     Initial Impression / Assessment and Plan / ED Course  I have reviewed the triage vital signs and the nursing notes.  Pertinent labs & imaging results that were available during my care of the patient were reviewed by me and considered in my medical decision making (see chart for details).     Patient's workup for the chest pain and abdominal pain without acute findings. There is some evidence of increased stool burden for constipation this can be treated with MiraLAX. Chest pain workup was negative. Labs  without significant abnormalities.  In addition chest pain does seem to be consistent with chest wall tenderness. It is reproducible.  Patient's other concern was that she feels unsafe in her home environment. Social worker was to evaluate her to determine home environment situation. That is still pending.  Final Clinical Impressions(s) / ED Diagnoses   Final diagnoses:  Precordial pain  Generalized abdominal pain  Other constipation  Chest wall pain    New Prescriptions New Prescriptions   No medications on file    Patient visited by social worker they have arranged a safe shelter for her. Patient will be treated with MiraLAX for the constipation and Naprosyn for the chest wall pain. Patient feels safe going to the shelter.   Vanetta Mulders, MD 09/21/16 [phone]

## 2016-09-21 NOTE — ED Triage Notes (Signed)
Pt presents via POV from home with c/o depression and chest pain. She states her home life has been extremely stressful lately with family members putting her down often. She denies SI or HI but requests psychiatric evaluation. She also notes CP x1.5 weeks - no prior cardiac history.

## 2016-09-21 NOTE — ED Notes (Signed)
Social work provided pt with resources to find a Horticulturist, commercial[facility] shelter and a bus pass. Dr. Deretha EmoryZackowski aware pt ready for d/c

## 2016-09-24 ENCOUNTER — Emergency Department (HOSPITAL_COMMUNITY)
Admission: EM | Admit: 2016-09-24 | Discharge: 2016-09-24 | Disposition: A | Discharge status: Home / Self Care | Payer: Medicaid Other | Attending: Emergency Medicine | Admitting: Emergency Medicine

## 2016-09-24 ENCOUNTER — Encounter (HOSPITAL_COMMUNITY): Payer: Self-pay | Admitting: Emergency Medicine

## 2016-09-24 DIAGNOSIS — F17210 Nicotine dependence, cigarettes, uncomplicated: Secondary | ICD-10-CM | POA: Insufficient documentation

## 2016-09-24 DIAGNOSIS — Z79899 Other long term (current) drug therapy: Secondary | ICD-10-CM | POA: Insufficient documentation

## 2016-09-24 DIAGNOSIS — S51811A Laceration without foreign body of right forearm, initial encounter: Secondary | ICD-10-CM | POA: Insufficient documentation

## 2016-09-24 DIAGNOSIS — F909 Attention-deficit hyperactivity disorder, unspecified type: Secondary | ICD-10-CM | POA: Insufficient documentation

## 2016-09-24 DIAGNOSIS — Y939 Activity, unspecified: Secondary | ICD-10-CM | POA: Diagnosis not present

## 2016-09-24 DIAGNOSIS — S59911A Unspecified injury of right forearm, initial encounter: Secondary | ICD-10-CM | POA: Diagnosis present

## 2016-09-24 DIAGNOSIS — W260XXA Contact with knife, initial encounter: Secondary | ICD-10-CM | POA: Diagnosis not present

## 2016-09-24 DIAGNOSIS — Y9269 Other specified industrial and construction area as the place of occurrence of the external cause: Secondary | ICD-10-CM | POA: Diagnosis not present

## 2016-09-24 DIAGNOSIS — F329 Major depressive disorder, single episode, unspecified: Secondary | ICD-10-CM

## 2016-09-24 DIAGNOSIS — J45909 Unspecified asthma, uncomplicated: Secondary | ICD-10-CM | POA: Diagnosis not present

## 2016-09-24 DIAGNOSIS — Y999 Unspecified external cause status: Secondary | ICD-10-CM | POA: Diagnosis not present

## 2016-09-24 DIAGNOSIS — F32A Depression, unspecified: Secondary | ICD-10-CM

## 2016-09-24 LAB — ETHANOL

## 2016-09-24 LAB — RAPID URINE DRUG SCREEN, HOSP PERFORMED
AMPHETAMINES: NOT DETECTED
BARBITURATES: NOT DETECTED
BENZODIAZEPINES: NOT DETECTED
Cocaine: NOT DETECTED
OPIATES: NOT DETECTED
TETRAHYDROCANNABINOL: NOT DETECTED

## 2016-09-24 LAB — COMPREHENSIVE METABOLIC PANEL
ALBUMIN: 4.3 g/dL (ref 3.5–5.0)
ALT: 13 U/L — ABNORMAL LOW (ref 14–54)
AST: 16 U/L (ref 15–41)
Alkaline Phosphatase: 67 U/L (ref 38–126)
Anion gap: 5 (ref 5–15)
BUN: 7 mg/dL (ref 6–20)
CHLORIDE: 110 mmol/L (ref 101–111)
CO2: 26 mmol/L (ref 22–32)
Calcium: 9.4 mg/dL (ref 8.9–10.3)
Creatinine, Ser: 0.74 mg/dL (ref 0.44–1.00)
GFR calc Af Amer: >60 mL/min (ref >60)
Glucose, Bld: 92 mg/dL (ref 65–99)
POTASSIUM: 3.4 mmol/L — AB (ref 3.5–5.1)
Sodium: 141 mmol/L (ref 135–145)
Total Bilirubin: 0.6 mg/dL (ref 0.3–1.2)
Total Protein: 8 g/dL (ref 6.5–8.1)

## 2016-09-24 LAB — ACETAMINOPHEN LEVEL: Acetaminophen (Tylenol), Serum: <10 ug/mL — ABNORMAL LOW (ref 10–30)

## 2016-09-24 LAB — CBC
HCT: 39.3 % (ref 36.0–46.0)
Hemoglobin: 13.2 g/dL (ref 12.0–15.0)
MCH: 28.6 pg (ref 26.0–34.0)
MCHC: 33.6 g/dL (ref 30.0–36.0)
MCV: 85.1 fL (ref 78.0–100.0)
PLATELETS: 286 10*3/uL (ref 150–400)
RBC: 4.62 MIL/uL (ref 3.87–5.11)
RDW: 13.2 % (ref 11.5–15.5)
WBC: 5.5 10*3/uL (ref 4.0–10.5)

## 2016-09-24 LAB — SALICYLATE LEVEL: Salicylate Lvl: <7 mg/dL (ref 2.8–30.0)

## 2016-09-24 NOTE — ED Provider Notes (Signed)
[facility]-EMERGENCY DEPT Provider Note   CSN: [id number] Arrival date & time: 09/24/16  1342     History   Chief Complaint Chief Complaint  Patient presents with  . Medical Clearance    HPI Emily Massey is a 21 y.o. female.  Patient has a long-standing history of mental health problems including cutting of her forearms. She has been admitted to a psychiatric facility several times. Past psychiatric history includes ADHD, borderline personality, PTSD, cannabis, depression.  No specific suicidal or homicidal ideation. No evidence of psychosis.      Past Medical History:  Diagnosis Date  . Anemia   . Anxiety   . Asthma   . Depression   . Headache   . Mental disorder   . Seizures [facility])     Patient Active Problem List   Diagnosis Date Noted  . Attention deficit hyperactivity disorder (ADHD) 07/16/2015  . Tobacco use disorder 07/16/2015  . Borderline personality disorder 07/16/2015  . PTSD (post-traumatic stress disorder) 07/16/2015  . Asthma 07/16/2015  . Cannabis use disorder, mild, abuse 07/16/2015  . History of pseudoseizure 07/16/2015  . Moderate episode of recurrent major depressive disorder (HCC)   . MDD (major depressive disorder) 07/15/2015  . 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 Injury, self-inflicted 03/15/2013  . MDD (major depressive disorder), recurrent, severe, with psychosis (HCC) 03/04/2013  . Post traumatic stress disorder (PTSD) 03/04/2013    Past Surgical History:  Procedure Laterality Date  . EXTERNAL EAR SURGERY    . FOREIGN BODY REMOVAL Right 03/16/2013   Procedure: REMOVAL FOREIGN BODY EXTREMITY;  Surgeon: Sharma Covert, MD;  Location: [facility] OR;  Service: Orthopedics;  Laterality: Right;  . HAND SURGERY      OB History    Gravida Para Term Preterm AB Living   0 0 0 0 0     SAB TAB Ectopic Multiple Live Births   0 0 0           Home Medications    Prior to Admission medications   Medication Sig Start  Date End Date Taking? Authorizing Provider  albuterol (PROVENTIL HFA;VENTOLIN HFA) 108 (90 BASE) MCG/ACT inhaler Inhale 2 puffs into the lungs every 6 (six) hours as needed for wheezing.  03/07/13  Yes Jolene Schimke, NP  naproxen sodium (ANAPROX) 220 MG tablet Take 440 mg by mouth 2 (two) times daily as needed (for pain).   Yes Historical Provider, MD  cephALEXin (KEFLEX) 500 MG capsule Take 1 capsule (500 mg total) by mouth 2 (two) times daily. Patient not taking: Reported on 09/21/2016 08/16/15   Catha Gosselin, PA-C  cloNIDine (CATAPRES) 0.1 MG tablet Take 1 tablet (0.1 mg total) by mouth at bedtime. Patient not taking: Reported on 09/21/2016 07/20/15   Jimmy Footman, MD  dexmethylphenidate (FOCALIN XR) 15 MG 24 hr capsule Take 1 capsule (15 mg total) by mouth daily with breakfast. Patient not taking: Reported on 09/21/2016 07/20/15   Jimmy Footman, MD  mirtazapine (REMERON) 15 MG tablet Take 1 tablet (15 mg total) by mouth at bedtime. Patient not taking: Reported on 09/21/2016 07/20/15   Jimmy Footman, MD  naproxen (NAPROSYN) 500 MG tablet Take 1 tablet (500 mg total) by mouth 2 (two) times daily. Patient not taking: Reported on 09/24/2016 09/21/16   Vanetta Mulders, MD  polyethylene glycol [facility]) packet Take 17 g by mouth daily. Patient not taking: Reported on 09/24/2016 09/21/16   Vanetta Mulders, MD    Family History No family history on  file.  Social History Social History  Substance Use Topics  . Smoking status: Current Every Day Smoker    Packs/day: 1.00    Years: 1.00    Types: Cigarettes  . Smokeless tobacco: Never Used  . Alcohol use No     Allergies   Bee pollen-1000-royal jelly [nutritional supplements]; Bee venom; and Mushroom extract complex   Review of Systems Review of Systems  All other systems reviewed and are negative.    Physical Exam Updated Vital Signs BP 120/70 (BP Location: Right Arm)   Pulse 78   Temp 98.3 F (36.8  C) (Oral)   Resp 16   LMP 09/18/2016 (Exact Date)   SpO2 100%   Physical Exam  Constitutional: She is oriented to person, place, and time. She appears well-developed and well-nourished.  HENT:  Head: Normocephalic and atraumatic.  Eyes: Conjunctivae are normal.  Neck: Neck supple.  Cardiovascular: Normal rate and regular rhythm.   Pulmonary/Chest: Effort normal and breath sounds normal.  Abdominal: Soft. Bowel sounds are normal.  Musculoskeletal: Normal range of motion.  Neurological: She is alert and oriented to person, place, and time.  Skin:  Right forearm: Superficial 5 cm laceration on the dorsum of her forearm  Psychiatric:  Flat affect, depressed  Nursing note and vitals reviewed.    ED Treatments / Results  Labs (all labs ordered are listed, but only abnormal results are displayed) Labs Reviewed  COMPREHENSIVE METABOLIC PANEL  ETHANOL  SALICYLATE LEVEL  ACETAMINOPHEN LEVEL  CBC  RAPID URINE DRUG SCREEN, HOSP PERFORMED    EKG  EKG Interpretation None       Radiology No results found.  Procedures Procedures (including critical care time)  Medications Ordered in ED Medications - No data to display   Initial Impression / Assessment and Plan / ED Course  I have reviewed the triage vital signs and the nursing notes.  Pertinent labs & imaging results that were available during my care of the patient were reviewed by me and considered in my medical decision making (see chart for details).     Patient has long-standing psychiatric problems. Will obtain behavioral health consult.  Final Clinical Impressions(s) / ED Diagnoses   Final diagnoses:  Depression, unspecified depression type  Laceration of right forearm, initial encounter    New Prescriptions New Prescriptions   No medications on file     Donnetta HutchingBrian Kimberlea Schlag, MD 09/24/16 1514

## 2016-09-24 NOTE — ED Triage Notes (Signed)
Patient's grandmother called EMS due to patient cutting her right arm superficially with a knife.  Patient denies suicidal thoughts saying she just likes to inflict pain on herself.  Patient has been diagnosed with bipolar and has been off her meds since last summer.
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
## 2016-09-24 NOTE — ED Notes (Signed)
Attempted to draw labs x 2 but unsuccessful.  Phlebotomy notified.

## 2016-09-24 NOTE — ED Provider Notes (Signed)
[facility] the [facility] and felt that she did not meet inpatient criteria. Pt has safety plan in place. Medically cleared previously. The patient is safe for discharge with strict return precautions.  Disposition: Discharge  Condition: Good  I have discussed the results, Dx and Tx plan with the patient who expressed understanding and agree(s) with the plan. Discharge instructions discussed at great length. The patient was given strict return precautions who verbalized understanding of the instructions. No further questions at time of discharge.    New Prescriptions   No medications on file    [facility] [address] [address] [place] [id number] [phone]         Nira ConnPedro Eduardo Vivek Grealish, MD 09/24/16 1739

## 2016-10-06 ENCOUNTER — Encounter (HOSPITAL_COMMUNITY): Payer: Self-pay | Admitting: *Deleted

## 2016-10-06 ENCOUNTER — Ambulatory Visit (HOSPITAL_COMMUNITY)
Admission: EM | Admit: 2016-10-06 | Discharge: 2016-10-06 | Disposition: A | Discharge status: Home / Self Care | Payer: Medicaid Other | Attending: Internal Medicine | Admitting: Internal Medicine

## 2016-10-06 DIAGNOSIS — H10021 Other mucopurulent conjunctivitis, right eye: Secondary | ICD-10-CM

## 2016-10-06 DIAGNOSIS — H109 Unspecified conjunctivitis: Secondary | ICD-10-CM

## 2016-10-06 LAB — SEDIMENTATION RATE: SED RATE: 7 mm/h (ref 0–22)

## 2016-10-06 NOTE — ED Triage Notes (Signed)
PT  HAS  REDNESS  PRESSURE       WITH   SYMPTOMS   TODAY   PT  IS   SITTING  UPRIGHT ON THE  EXAM TABLE      IN NO  ACUTE  DISTRESS

## 2016-10-06 NOTE — ED Provider Notes (Signed)
CSN: [id number]     Arrival date & time 10/06/16  1421 History   First MD Initiated Contact with Patient 10/06/16 1547     Chief Complaint  Patient presents with  . Eye Problem   (Consider location/radiation/quality/duration/timing/severity/associated sxs/prior Treatment) 21 year old female presents to the urgent care for evaluation of right eye pain and redness. States she first noticed the symptoms today, she is also complaining of a headache, and light sensitivity. She does complain of eye discharge, stating that she had discharge this morning when she woke up. With regard to her vision, states it's more blurry than normal, she has previously been prescribed eyeglasses, however she does not wear them. She has no sensation of flashing lights, or visual disturbances.  With regard to her headache, she does have a history of migraine, and she reports she is taken her Topamax today without relief. She states this headache is new, and different than her normal headaches, stating her normal headaches or towards the back of her head however this headache feels "behind the eye" and she states she feels a strong pressure behind the eye.   The history is provided by the patient.  Eye Pain  This is a new problem. The current episode started 6 to 12 hours ago. The problem occurs constantly. The problem has been gradually worsening. Associated symptoms include headaches. Pertinent negatives include no chest pain and no abdominal pain.    Past Medical History:  Diagnosis Date  . Anemia   . Anxiety   . Asthma   . Depression   . Headache   . Mental disorder   . Seizures (HCC)    Past Surgical History:  Procedure Laterality Date  . EXTERNAL EAR SURGERY    . FOREIGN BODY REMOVAL Right 03/16/2013   Procedure: REMOVAL FOREIGN BODY EXTREMITY;  Surgeon: Sharma Covert, MD;  Location: [facility] OR;  Service: Orthopedics;  Laterality: Right;  . HAND SURGERY     History reviewed. No pertinent family  history. Social History  Substance Use Topics  . Smoking status: Current Every Day Smoker    Packs/day: 1.00    Years: 1.00    Types: Cigarettes  . Smokeless tobacco: Never Used  . Alcohol use No   OB History    Gravida Para Term Preterm AB Living   0 0 0 0 0     SAB TAB Ectopic Multiple Live Births   0 0 0         Review of Systems  Constitutional: Negative for chills, fatigue and fever.  HENT: Negative for congestion, ear discharge, ear pain, sinus pain and sinus pressure.   Eyes: Positive for photophobia, pain, redness and visual disturbance.  Respiratory: Negative for chest tightness and wheezing.   Cardiovascular: Negative for chest pain and palpitations.  Gastrointestinal: Negative for abdominal pain, diarrhea, nausea and vomiting.  Genitourinary: Negative.   Musculoskeletal: Negative.   Skin: Negative.   Neurological: Positive for headaches. Negative for dizziness, facial asymmetry, weakness, light-headedness and numbness.  All other systems reviewed and are negative.   Allergies  Bee pollen-1000-royal jelly [nutritional supplements]; Bee venom; and Mushroom extract complex  Home Medications   Prior to Admission medications   Medication Sig Start Date End Date Taking? Authorizing Provider  albuterol (PROVENTIL HFA;VENTOLIN HFA) 108 (90 BASE) MCG/ACT inhaler Inhale 2 puffs into the lungs every 6 (six) hours as needed for wheezing.  03/07/13   Jolene Schimke, NP  naproxen sodium (ANAPROX) 220 MG tablet Take 440 mg by  mouth 2 (two) times daily as needed (for pain).    Historical Provider, MD  polyethylene glycol (MIRALAX) packet Take 17 g by mouth daily. Patient not taking: Reported on 09/24/2016 09/21/16   Vanetta Mulders, MD   Meds Ordered and Administered this Visit  Medications - No data to display  BP 111/68 (BP Location: Left Arm)   Pulse 85   Temp 98.4 F (36.9 C) (Oral)   Resp 12   LMP 09/18/2016 (Exact Date)   SpO2 99%  No data found.   Physical Exam   Constitutional: She is oriented to person, place, and time. She appears well-developed and well-nourished. No distress.  HENT:  Head: Normocephalic and atraumatic.  Right Ear: External ear normal.  Left Ear: External ear normal.  Eyes: EOM and lids are normal. Pupils are equal, round, and reactive to light. Right eye exhibits discharge. Right conjunctiva is injected. Right conjunctiva has no hemorrhage. Left conjunctiva is not injected. Left conjunctiva has no hemorrhage.  Fundoscopic exam:      The right eye shows no exudate, no hemorrhage and no papilledema.       The left eye shows no exudate, no hemorrhage and no papilledema.     Right Eye Distance: 20/100 Left Eye Distance: 20/50 Bilateral Distance: 20/50  Peripheral distance estimated by confrontation, appeared as if right eye had reduced visual fields   Neck: Normal range of motion. Neck supple.  Cardiovascular: Normal rate and regular rhythm.   Pulmonary/Chest: Effort normal and breath sounds normal.  Abdominal: Bowel sounds are normal.  Lymphadenopathy:    She has no cervical adenopathy.  Neurological: She is alert and oriented to person, place, and time.  Skin: Skin is warm and dry. Capillary refill takes less than 2 seconds. No rash noted. She is not diaphoretic. No erythema.  Psychiatric: She has a normal mood and affect. Her behavior is normal.  Nursing note and vitals reviewed.   Urgent Care Course     Procedures (including critical care time)  Labs Review Labs Reviewed  SEDIMENTATION RATE    Imaging Review No results found.   Visual Acuity Review  Right Eye Distance: 20/100 Left Eye Distance: 20/50 Bilateral Distance: 20/50  Right Eye Near:   Left Eye Near:    Bilateral Near:         MDM   1. Conjunctivitis of right eye, unspecified conjunctivitis type    Consultation made with Dr. Edrick Oh, Dr. Edrick Oh came to clinic and assessed patient here. She provided patient with prescription for eyedrops  for dry eyes, recommend patient follow-up in her clinic in 1-2 weeks as needed.    Dorena Bodo, NP 10/06/16 2048

## 2016-10-06 NOTE — Discharge Instructions (Signed)
Follow the directions provided by Dr. Edrick Oh and follow up with her office should your symptoms persist.
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
## 2017-04-29 ENCOUNTER — Emergency Department (HOSPITAL_COMMUNITY)
Admission: EM | Admit: 2017-04-29 | Discharge: 2017-04-29 | Disposition: A | Discharge status: Home / Self Care | Payer: Medicaid Other | Attending: Emergency Medicine | Admitting: Emergency Medicine

## 2017-04-29 ENCOUNTER — Encounter (HOSPITAL_COMMUNITY): Payer: Self-pay | Admitting: Emergency Medicine

## 2017-04-29 ENCOUNTER — Ambulatory Visit (HOSPITAL_COMMUNITY)
Admission: EM | Admit: 2017-04-29 | Discharge: 2017-04-29 | Disposition: A | Discharge status: Home / Self Care | Payer: No Typology Code available for payment source | Source: Ambulatory Visit | Attending: Emergency Medicine | Admitting: Emergency Medicine

## 2017-04-29 ENCOUNTER — Ambulatory Visit (HOSPITAL_COMMUNITY)
Admission: RE | Admit: 2017-04-29 | Discharge: 2017-04-29 | Disposition: A | Discharge status: Home / Self Care | Payer: No Typology Code available for payment source | Attending: Psychiatry | Admitting: Psychiatry

## 2017-04-29 DIAGNOSIS — F4310 Post-traumatic stress disorder, unspecified: Secondary | ICD-10-CM | POA: Diagnosis present

## 2017-04-29 DIAGNOSIS — F32A Depression, unspecified: Secondary | ICD-10-CM

## 2017-04-29 DIAGNOSIS — R45851 Suicidal ideations: Secondary | ICD-10-CM | POA: Insufficient documentation

## 2017-04-29 DIAGNOSIS — R440 Auditory hallucinations: Secondary | ICD-10-CM | POA: Diagnosis not present

## 2017-04-29 DIAGNOSIS — F17210 Nicotine dependence, cigarettes, uncomplicated: Secondary | ICD-10-CM | POA: Insufficient documentation

## 2017-04-29 DIAGNOSIS — F329 Major depressive disorder, single episode, unspecified: Secondary | ICD-10-CM

## 2017-04-29 DIAGNOSIS — T7621XA Adult sexual abuse, suspected, initial encounter: Secondary | ICD-10-CM | POA: Insufficient documentation

## 2017-04-29 DIAGNOSIS — F323 Major depressive disorder, single episode, severe with psychotic features: Secondary | ICD-10-CM | POA: Diagnosis not present

## 2017-04-29 DIAGNOSIS — F909 Attention-deficit hyperactivity disorder, unspecified type: Secondary | ICD-10-CM | POA: Insufficient documentation

## 2017-04-29 DIAGNOSIS — T7421XA Adult sexual abuse, confirmed, initial encounter: Secondary | ICD-10-CM

## 2017-04-29 DIAGNOSIS — F603 Borderline personality disorder: Secondary | ICD-10-CM | POA: Diagnosis present

## 2017-04-29 DIAGNOSIS — Z0441 Encounter for examination and observation following alleged adult rape: Secondary | ICD-10-CM | POA: Insufficient documentation

## 2017-04-29 DIAGNOSIS — J45909 Unspecified asthma, uncomplicated: Secondary | ICD-10-CM | POA: Insufficient documentation

## 2017-04-29 DIAGNOSIS — Z79899 Other long term (current) drug therapy: Secondary | ICD-10-CM | POA: Diagnosis not present

## 2017-04-29 LAB — COMPREHENSIVE METABOLIC PANEL
ALBUMIN: 4 g/dL (ref 3.5–5.0)
ALK PHOS: 70 U/L (ref 38–126)
ALT: 11 U/L — ABNORMAL LOW (ref 14–54)
AST: 18 U/L (ref 15–41)
Anion gap: 11 (ref 5–15)
BILIRUBIN TOTAL: 0.5 mg/dL (ref 0.3–1.2)
BUN: 9 mg/dL (ref 6–20)
CO2: 23 mmol/L (ref 22–32)
CREATININE: 0.92 mg/dL (ref 0.44–1.00)
Calcium: 8.9 mg/dL (ref 8.9–10.3)
Chloride: 107 mmol/L (ref 101–111)
GFR calc Af Amer: >60 mL/min (ref >60)
GFR calc non Af Amer: >60 mL/min (ref >60)
Glucose, Bld: 88 mg/dL (ref 65–99)
POTASSIUM: 3.3 mmol/L — AB (ref 3.5–5.1)
Sodium: 141 mmol/L (ref 135–145)
Total Protein: 7.5 g/dL (ref 6.5–8.1)

## 2017-04-29 LAB — I-STAT BETA HCG BLOOD, ED (MC, WL, AP ONLY)

## 2017-04-29 LAB — URINALYSIS, ROUTINE W REFLEX MICROSCOPIC
BILIRUBIN URINE: NEGATIVE
Glucose, UA: NEGATIVE mg/dL
HGB URINE DIPSTICK: NEGATIVE
Ketones, ur: NEGATIVE mg/dL
Leukocytes, UA: NEGATIVE
Nitrite: NEGATIVE
PH: 6.5 (ref 5.0–8.0)
Protein, ur: NEGATIVE mg/dL
Specific Gravity, Urine: 1.02 (ref 1.005–1.030)

## 2017-04-29 LAB — ETHANOL: Alcohol, Ethyl (B): <10 mg/dL (ref <10)

## 2017-04-29 LAB — RAPID URINE DRUG SCREEN, HOSP PERFORMED
AMPHETAMINES: NOT DETECTED
Barbiturates: NOT DETECTED
Benzodiazepines: NOT DETECTED
COCAINE: NOT DETECTED
OPIATES: NOT DETECTED
TETRAHYDROCANNABINOL: NOT DETECTED

## 2017-04-29 LAB — CBC WITH DIFFERENTIAL/PLATELET
BASOS PCT: 0 %
Basophils Absolute: 0 10*3/uL (ref 0.0–0.1)
EOS ABS: 0 10*3/uL (ref 0.0–0.7)
Eosinophils Relative: 1 %
HCT: 37.3 % (ref 36.0–46.0)
Hemoglobin: 12.4 g/dL (ref 12.0–15.0)
Lymphocytes Relative: 52 %
Lymphs Abs: 3.5 10*3/uL (ref 0.7–4.0)
MCH: 28.1 pg (ref 26.0–34.0)
MCHC: 33.2 g/dL (ref 30.0–36.0)
MCV: 84.4 fL (ref 78.0–100.0)
MONO ABS: 0.2 10*3/uL (ref 0.1–1.0)
MONOS PCT: 3 %
Neutro Abs: 2.9 10*3/uL (ref 1.7–7.7)
Neutrophils Relative %: 44 %
Platelets: 234 10*3/uL (ref 150–400)
RBC: 4.42 MIL/uL (ref 3.87–5.11)
RDW: 14.6 % (ref 11.5–15.5)
WBC: 6.6 10*3/uL (ref 4.0–10.5)

## 2017-04-29 MED ORDER — ZOLPIDEM TARTRATE 5 MG PO TABS: 5.0000 mg | ORAL_TABLET | Freq: Every evening | ORAL | Status: DC | PRN

## 2017-04-29 MED ORDER — AZITHROMYCIN 250 MG PO TABS
1000.0000 mg | ORAL_TABLET | Freq: Once | ORAL | Status: AC
  Administered 2017-04-29: 1000 mg via ORAL
  Filled 2017-04-29: qty 4

## 2017-04-29 MED ORDER — ONDANSETRON HCL 4 MG PO TABS: 4.0000 mg | ORAL_TABLET | Freq: Three times a day (TID) | ORAL | Status: DC | PRN

## 2017-04-29 MED ORDER — POTASSIUM CHLORIDE CRYS ER 20 MEQ PO TBCR: 40.0000 meq | EXTENDED_RELEASE_TABLET | Freq: Once | ORAL | Status: DC

## 2017-04-29 MED ORDER — ALUM & MAG HYDROXIDE-SIMETH 200-200-20 MG/5ML PO SUSP: 30.0000 mL | Freq: Four times a day (QID) | ORAL | Status: DC | PRN

## 2017-04-29 MED ORDER — ACETAMINOPHEN 325 MG PO TABS: 650.0000 mg | ORAL_TABLET | ORAL | Status: DC | PRN

## 2017-04-29 MED ORDER — METRONIDAZOLE 500 MG PO TABS
2000.0000 mg | ORAL_TABLET | Freq: Once | ORAL | Status: AC
  Administered 2017-04-29: 2000 mg via ORAL
  Filled 2017-04-29: qty 4

## 2017-04-29 MED ORDER — NICOTINE 14 MG/24HR TD PT24: 14.0000 mg | MEDICATED_PATCH | Freq: Every day | TRANSDERMAL | Status: DC

## 2017-04-29 MED ORDER — POTASSIUM CHLORIDE CRYS ER 20 MEQ PO TBCR
40.0000 meq | EXTENDED_RELEASE_TABLET | Freq: Once | ORAL | Status: AC
  Administered 2017-04-29: 40 meq via ORAL
  Filled 2017-04-29: qty 2

## 2017-04-29 MED ORDER — CEFTRIAXONE SODIUM 250 MG IJ SOLR
250.0000 mg | Freq: Once | INTRAMUSCULAR | Status: AC
  Administered 2017-04-29: 250 mg via INTRAMUSCULAR
  Filled 2017-04-29: qty 250

## 2017-04-29 MED ORDER — LIDOCAINE HCL (PF) 1 % IJ SOLN
0.9000 mL | Freq: Once | INTRAMUSCULAR | Status: AC
  Administered 2017-04-29: 0.9 mL
  Filled 2017-04-29: qty 5

## 2017-04-29 MED ORDER — ALBUTEROL SULFATE HFA 108 (90 BASE) MCG/ACT IN AERS: 2.0000 | INHALATION_SPRAY | Freq: Four times a day (QID) | RESPIRATORY_TRACT | Status: DC | PRN
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
## 2017-04-29 NOTE — ED Notes (Signed)
unsuccessful attempt at drawing blood. Will have someone else attempt.

## 2017-04-29 NOTE — ED Notes (Signed)
SANE Nurse with patient at this time.

## 2017-04-29 NOTE — ED Notes (Signed)
Bed: [facility] Expected date:  Expected time:  Means of arrival:  Comments: 

## 2017-04-29 NOTE — BH Assessment (Signed)
[facility] Assessment Progress Note   Per Nira ConnJason Berry, NP pt meets criteria for inpt hospitalization. EDP Dione BoozeGlick, David, MD notified of disposition and in agreement. TTS attempted to contact the pt's nurse but was placed on hold for several minutes by nurse secretary until line disconnected.  Princess BruinsAquicha Tyeshia Cornforth, MSW, LCSW Therapeutic Triage Specialist  [phone]]

## 2017-04-29 NOTE — ED Provider Notes (Signed)
[facility] - they indicate patient psychiatrically stable for d/c.   Pt alert, content, appearnig, no distress. Vitals normal.   SANE nurse assessment completed.  Patient currently appears stable for d/c.      Cathren LaineSteinl, Demetris Meinhardt, MD 04/29/17 1240

## 2017-04-29 NOTE — ED Triage Notes (Signed)
Pt does report to hearing voices that are telling pt to harm self. Pt currently denies any SI/HI. Pt does report to being homeless.
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
## 2017-04-29 NOTE — ED Notes (Signed)
Dr.Glick notified of pt and complaints.

## 2017-04-29 NOTE — ED Notes (Signed)
All personal belongings have been given back to pt, she verbalizes she understands her instructions

## 2017-04-29 NOTE — ED Notes (Signed)
Pt has a backpack, red and black jacket, shorts, shoes, socks with cell phone and wallet in shoe bag at desk
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
## 2017-04-29 NOTE — ED Notes (Signed)
Writer went to obtain blood work from patient but [employer] at bedside.
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
## 2017-04-29 NOTE — SANE Note (Signed)
Follow-up Phone Call  Patient gives verbal consent for a FNE/SANE follow-up phone call in 48-72 hours: yes Patient's telephone number: [phone] Patient gives verbal consent to leave voicemail at the phone number listed above: yes DO NOT CALL between the hours of: n/a
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
## 2017-04-29 NOTE — ED Notes (Signed)
Bed: [id number] Expected date:  Expected time:  Means of arrival:  Comments:  Duke Salviaandolph
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
## 2017-04-29 NOTE — ED Notes (Signed)
SANE Nurse at bedside 
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
## 2017-04-29 NOTE — ED Notes (Signed)
Pt has been instructed to change into scrubs and socks.

## 2017-04-30 LAB — HIV ANTIBODY (ROUTINE TESTING W REFLEX): HIV Screen 4th Generation wRfx: NONREACTIVE

## 2017-04-30 LAB — RPR: RPR Ser Ql: NONREACTIVE

## 2017-05-14 ENCOUNTER — Encounter: Payer: Self-pay | Admitting: Advanced Practice Midwife

## 2017-05-21 ENCOUNTER — Other Ambulatory Visit: Payer: Self-pay

## 2017-05-21 ENCOUNTER — Emergency Department (HOSPITAL_COMMUNITY)
Admission: EM | Admit: 2017-05-21 | Discharge: 2017-05-22 | Disposition: A | Discharge status: Other Acute Inpatient Hospital | Payer: Medicaid Other | Attending: Emergency Medicine | Admitting: Emergency Medicine

## 2017-05-21 ENCOUNTER — Encounter (HOSPITAL_COMMUNITY): Payer: Self-pay

## 2017-05-21 DIAGNOSIS — F17210 Nicotine dependence, cigarettes, uncomplicated: Secondary | ICD-10-CM | POA: Diagnosis not present

## 2017-05-21 DIAGNOSIS — T50902A Poisoning by unspecified drugs, medicaments and biological substances, intentional self-harm, initial encounter: Secondary | ICD-10-CM | POA: Diagnosis not present

## 2017-05-21 DIAGNOSIS — J45909 Unspecified asthma, uncomplicated: Secondary | ICD-10-CM | POA: Diagnosis not present

## 2017-05-21 DIAGNOSIS — R45851 Suicidal ideations: Secondary | ICD-10-CM | POA: Insufficient documentation

## 2017-05-21 DIAGNOSIS — F329 Major depressive disorder, single episode, unspecified: Secondary | ICD-10-CM | POA: Diagnosis not present

## 2017-05-21 DIAGNOSIS — Z79899 Other long term (current) drug therapy: Secondary | ICD-10-CM | POA: Diagnosis not present

## 2017-05-21 DIAGNOSIS — Z046 Encounter for general psychiatric examination, requested by authority: Secondary | ICD-10-CM | POA: Insufficient documentation

## 2017-05-21 LAB — COMPREHENSIVE METABOLIC PANEL
ALBUMIN: 4.5 g/dL (ref 3.5–5.0)
ALK PHOS: 73 U/L (ref 38–126)
ALT: 13 U/L — ABNORMAL LOW (ref 14–54)
ANION GAP: 10 (ref 5–15)
AST: 19 U/L (ref 15–41)
BILIRUBIN TOTAL: 0.5 mg/dL (ref 0.3–1.2)
BUN: 14 mg/dL (ref 6–20)
CALCIUM: 9.3 mg/dL (ref 8.9–10.3)
CO2: 24 mmol/L (ref 22–32)
Chloride: 104 mmol/L (ref 101–111)
Creatinine, Ser: 0.85 mg/dL (ref 0.44–1.00)
GFR calc non Af Amer: >60 mL/min (ref >60)
GLUCOSE: 82 mg/dL (ref 65–99)
Potassium: 3.3 mmol/L — ABNORMAL LOW (ref 3.5–5.1)
Sodium: 138 mmol/L (ref 135–145)
TOTAL PROTEIN: 8.3 g/dL — AB (ref 6.5–8.1)

## 2017-05-21 LAB — SALICYLATE LEVEL: Salicylate Lvl: <7 mg/dL (ref 2.8–30.0)

## 2017-05-21 LAB — CBC
HEMATOCRIT: 41.2 % (ref 36.0–46.0)
Hemoglobin: 13.9 g/dL (ref 12.0–15.0)
MCH: 28.8 pg (ref 26.0–34.0)
MCHC: 33.7 g/dL (ref 30.0–36.0)
MCV: 85.3 fL (ref 78.0–100.0)
Platelets: 300 10*3/uL (ref 150–400)
RBC: 4.83 MIL/uL (ref 3.87–5.11)
RDW: 14.3 % (ref 11.5–15.5)
WBC: 5.8 10*3/uL (ref 4.0–10.5)

## 2017-05-21 LAB — RAPID URINE DRUG SCREEN, HOSP PERFORMED
Amphetamines: NOT DETECTED
BARBITURATES: NOT DETECTED
Benzodiazepines: NOT DETECTED
COCAINE: NOT DETECTED
Opiates: NOT DETECTED
TETRAHYDROCANNABINOL: NOT DETECTED

## 2017-05-21 LAB — I-STAT BETA HCG BLOOD, ED (MC, WL, AP ONLY)

## 2017-05-21 LAB — ACETAMINOPHEN LEVEL

## 2017-05-21 LAB — ETHANOL: Alcohol, Ethyl (B): <10 mg/dL (ref <10)

## 2017-05-21 MED ORDER — RISPERIDONE 2 MG PO TBDP: 2.0000 mg | ORAL_TABLET | Freq: Three times a day (TID) | ORAL | Status: DC | PRN

## 2017-05-21 MED ORDER — ONDANSETRON HCL 4 MG PO TABS: 4.0000 mg | ORAL_TABLET | Freq: Three times a day (TID) | ORAL | Status: DC | PRN

## 2017-05-21 MED ORDER — POLYETHYLENE GLYCOL 3350 17 G PO PACK: 17.0000 g | PACK | Freq: Every day | ORAL | Status: DC

## 2017-05-21 MED ORDER — ACETAMINOPHEN 325 MG PO TABS: 650.0000 mg | ORAL_TABLET | ORAL | Status: DC | PRN

## 2017-05-21 MED ORDER — ALBUTEROL SULFATE HFA 108 (90 BASE) MCG/ACT IN AERS: 2.0000 | INHALATION_SPRAY | Freq: Four times a day (QID) | RESPIRATORY_TRACT | Status: DC | PRN

## 2017-05-21 MED ORDER — ZIPRASIDONE MESYLATE 20 MG IM SOLR: 20.0000 mg | INTRAMUSCULAR | Status: DC | PRN

## 2017-05-21 MED ORDER — LORAZEPAM 1 MG PO TABS: 1.0000 mg | ORAL_TABLET | ORAL | Status: DC | PRN
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
## 2017-05-22 NOTE — Progress Notes (Signed)
Patient meets criteria for inpatient treatment. CSW faxed referrals to the following inpatient facilities for review:  [facility], [facility], [facility], [facility], [facility], [address], [facility], [facility], [facility], [facility]    TTS will continue to seek bed placement.   Baldo DaubJolan Tarry Fountain MSW, LCSWA CSW Disposition [phone]

## 2017-05-22 NOTE — BHH Counselor (Signed)
Called to set up Tele-assessment. Per nurse, TA equipment not working currently and she is with a critial pt and cannot work on getting the equipment started. Assessment cannot be done at this time.

## 2017-05-22 NOTE — Progress Notes (Signed)
Accepted to [facility] in [place], [place].   Accepting/attending physician is Dr. Estill Cottahomas Cornwall.  [facility].  Number for report is [phone] Patient can transport at anytime, bed is ready.    Maxie Barbhrislynn King, RN notified.    Baldo DaubJolan Lillyth Spong MSW, LCSWA CSW Disposition [phone]

## 2017-05-22 NOTE — ED Notes (Signed)
[facility]
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
## 2018-09-14 IMAGING — CT CT ABD-PELV W/ CM
2 of 4 series · 17 of 46 positions shown, 19 images · IV contrast (Omni 300)
Comparison: Pelvic ultrasound 10/29/2013

CLINICAL DATA: Right lower quadrant pain radiating to left side.

EXAM:
CT ABDOMEN AND PELVIS WITH CONTRAST
TECHNIQUE: Multidetector CT imaging of the abdomen and pelvis was performed
using the standard protocol following bolus administration of
intravenous contrast.
CONTRAST:  100mL R63L3H-NMM IOPAMIDOL (R63L3H-NMM) INJECTION 61%

[Series 3: a/p w/ 5mm · axial · 0.69mm/px · z∈[+884,+1304]mm · 14 of 92 slices shown, 16 images]
[im 4/92  soft-tissue]
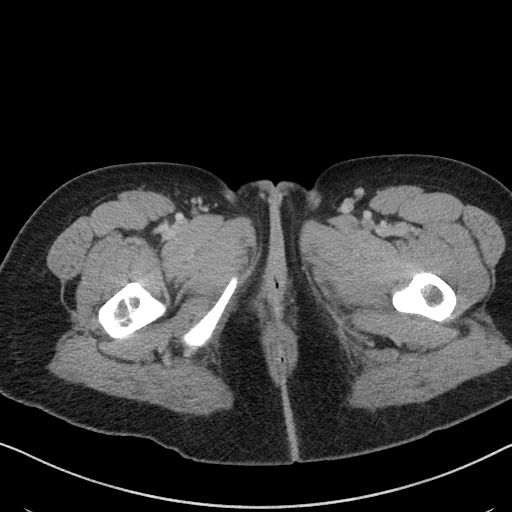
[im 4/92  bone]
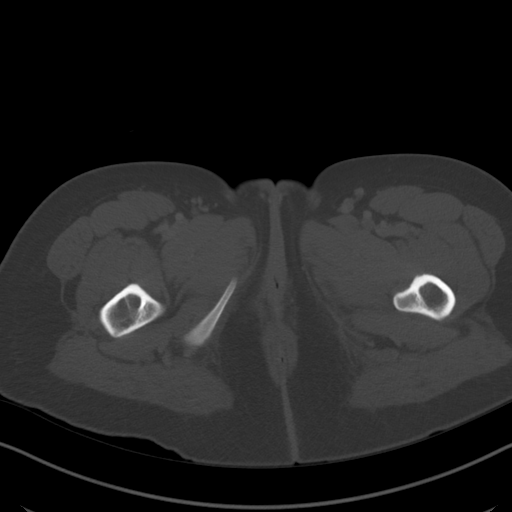
[im 12/92  soft-tissue]
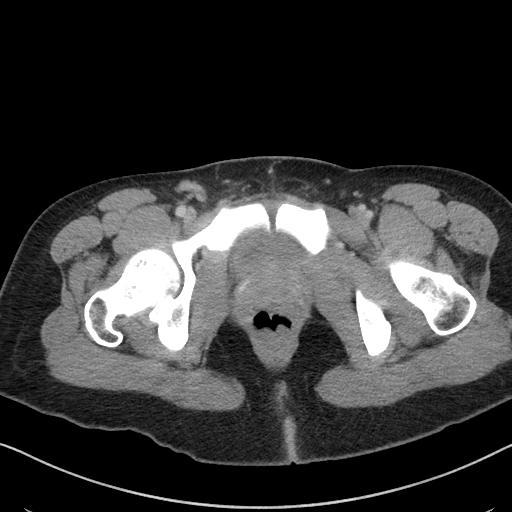
[im 19/92  soft-tissue]
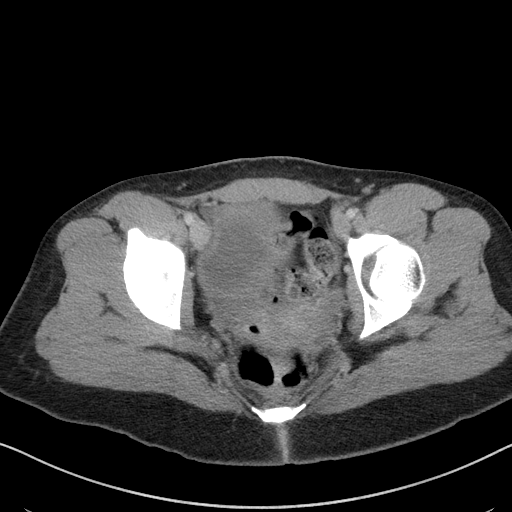
[im 23/92  soft-tissue]
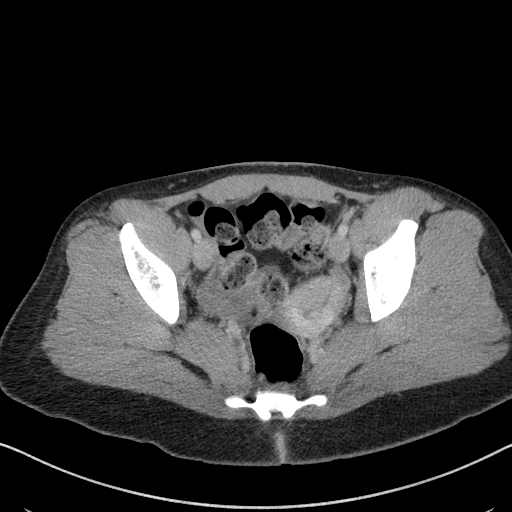
[im 31/92  soft-tissue]
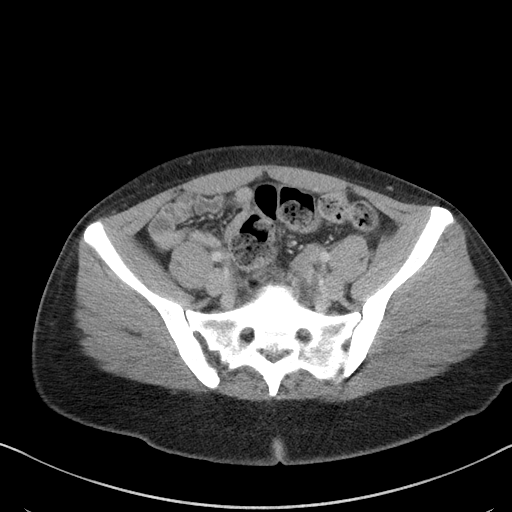
[im 38/92  soft-tissue]
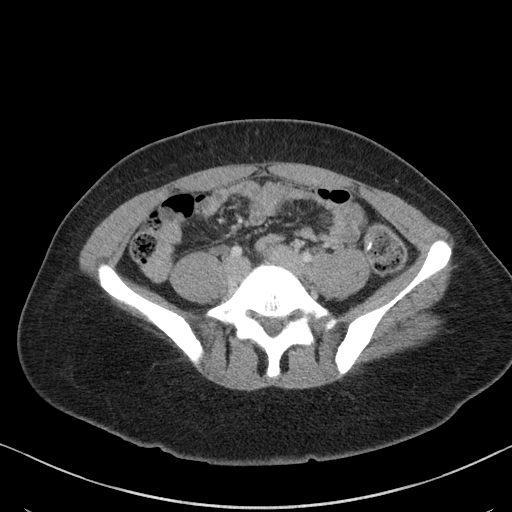
[im 42/92  soft-tissue]
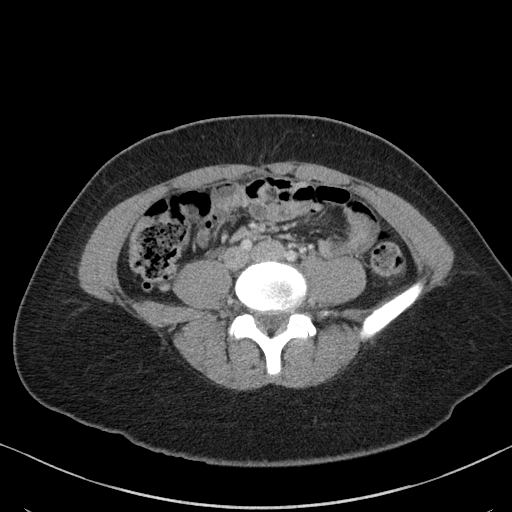
[im 50/92  soft-tissue]
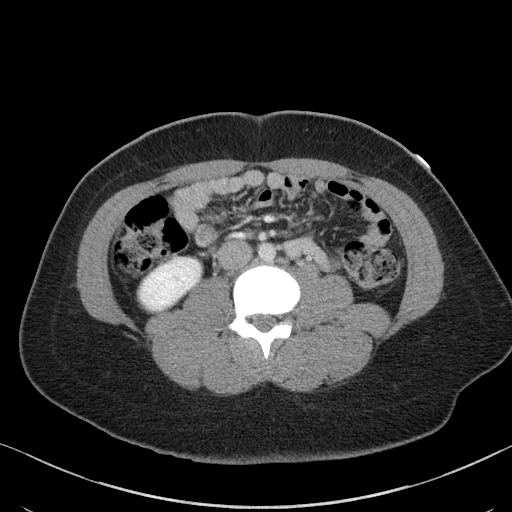
[im 54/92  soft-tissue]
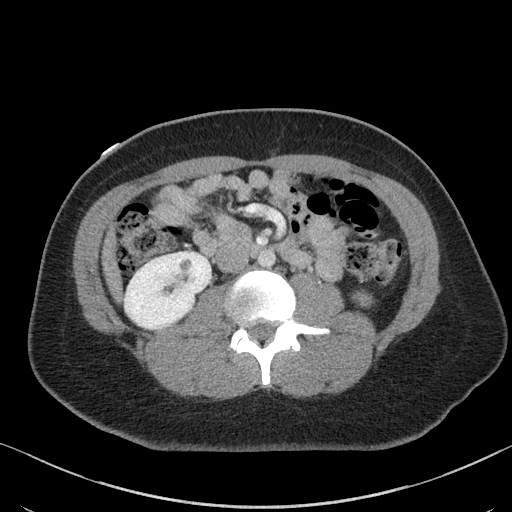
[im 54/92  bone]
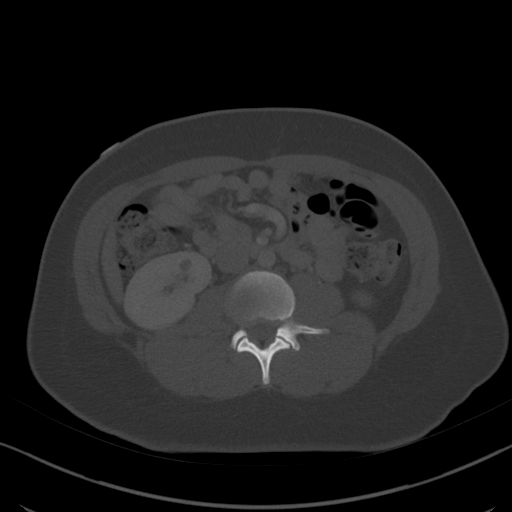
[im 61/92  soft-tissue]
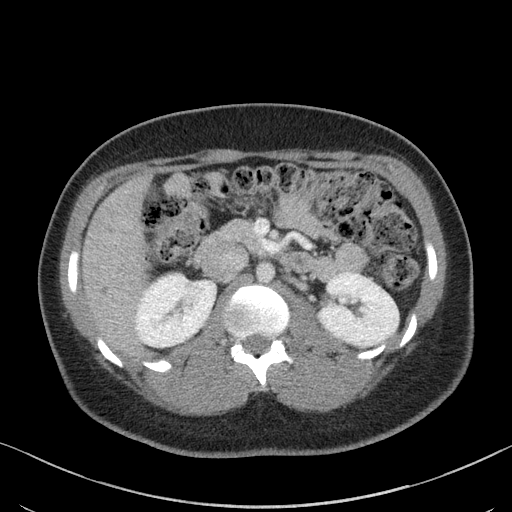
[im 69/92  soft-tissue]
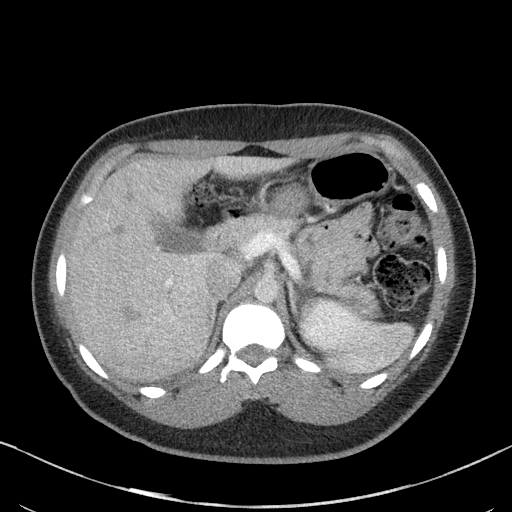
[im 73/92  soft-tissue]
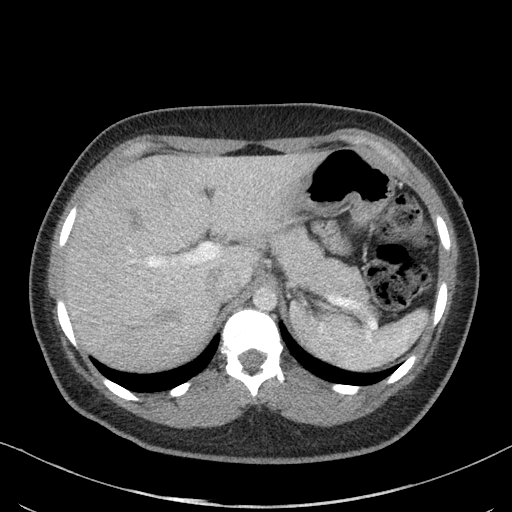
[im 80/92  soft-tissue]
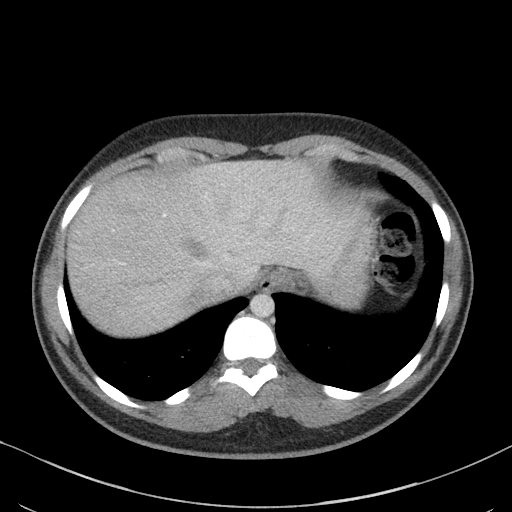
[im 88/92  soft-tissue]
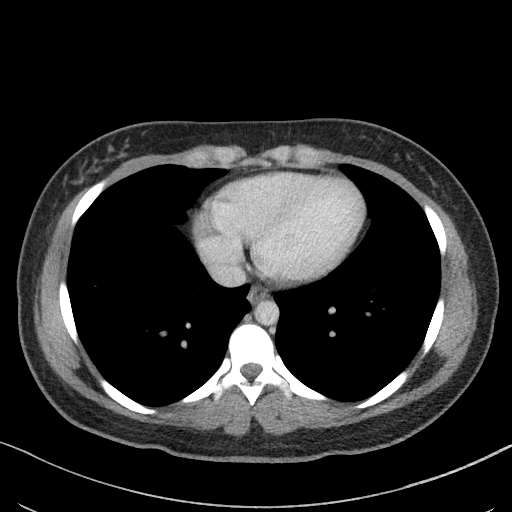

[Series 6: a/p w/ cor · coronal · 0.89mm/px · 3 of 142 slices shown]
[im 48/142  soft-tissue]
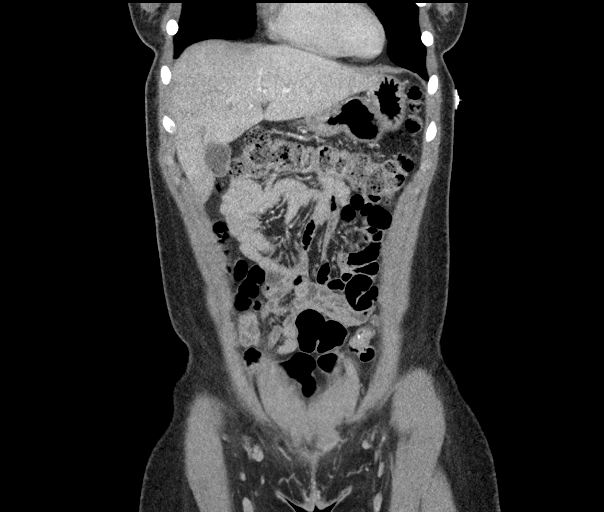
[im 63/142  soft-tissue]
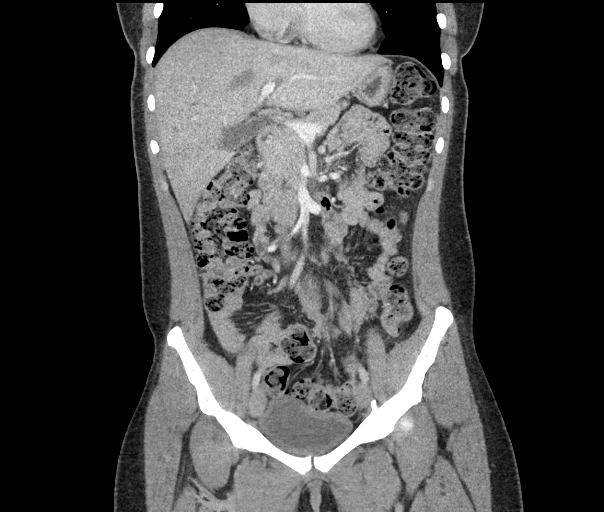
[im 79/142  soft-tissue]
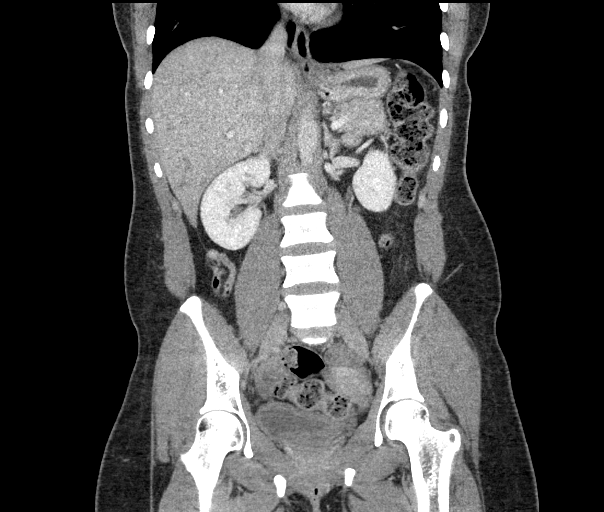

[17 of 46 positions shown; findings below may reference images not displayed]

FINDINGS: Lower chest: Lung bases are clear. No effusions. Heart is normal
size.

Hepatobiliary: Small hypodensity in the left hepatic lobe, likely
small cysts. Gallbladder unremarkable.

Pancreas: No focal abnormality or ductal dilatation.

Spleen: No focal abnormality.  Normal size.

Adrenals/Urinary Tract: Small hypodensity in the midpole of the
right kidney compatible with small cysts. No hydronephrosis. Adrenal
glands and urinary bladder are unremarkable.

Stomach/Bowel: Large stool burden throughout the colon. Normal
appendix. Stomach and small bowel decompressed, grossly
unremarkable.

Vascular/Lymphatic: No evidence of aneurysm or adenopathy.

Reproductive: Uterus and adnexa unremarkable.  No mass.

Other: No free fluid or free air.

Musculoskeletal: No acute bony abnormality.
IMPRESSION: Normal appendix.

No acute findings in the abdomen or pelvis.

Large stool burden throughout the colon.

## 2019-03-01 ENCOUNTER — Other Ambulatory Visit: Payer: Self-pay

## 2019-03-01 ENCOUNTER — Encounter (HOSPITAL_COMMUNITY): Payer: Self-pay | Admitting: *Deleted

## 2019-03-01 ENCOUNTER — Emergency Department (HOSPITAL_COMMUNITY)
Admission: EM | Admit: 2019-03-01 | Discharge: 2019-03-01 | Disposition: A | Discharge status: Home / Self Care | Payer: Medicaid - Out of State | Attending: Emergency Medicine | Admitting: Emergency Medicine

## 2019-03-01 DIAGNOSIS — H9213 Otorrhea, bilateral: Secondary | ICD-10-CM | POA: Insufficient documentation

## 2019-03-01 DIAGNOSIS — J020 Streptococcal pharyngitis: Secondary | ICD-10-CM

## 2019-03-01 DIAGNOSIS — J029 Acute pharyngitis, unspecified: Secondary | ICD-10-CM | POA: Diagnosis present

## 2019-03-01 DIAGNOSIS — F17210 Nicotine dependence, cigarettes, uncomplicated: Secondary | ICD-10-CM | POA: Insufficient documentation

## 2019-03-01 DIAGNOSIS — H9201 Otalgia, right ear: Secondary | ICD-10-CM | POA: Insufficient documentation

## 2019-03-01 DIAGNOSIS — J45909 Unspecified asthma, uncomplicated: Secondary | ICD-10-CM | POA: Insufficient documentation

## 2019-03-01 LAB — GROUP A STREP BY PCR: Group A Strep by PCR: DETECTED — AB

## 2019-03-01 MED ORDER — PENICILLIN G BENZATHINE 1200000 UNIT/2ML IM SUSP
1.2000 10*6.[IU] | Freq: Once | INTRAMUSCULAR | Status: AC
Start: 2019-03-01 — End: 2019-03-01
  Administered 2019-03-01: 19:00:00 1.2 10*6.[IU] via INTRAMUSCULAR
  Filled 2019-03-01: qty 2

## 2019-03-01 MED ORDER — DEXAMETHASONE SODIUM PHOSPHATE 10 MG/ML IJ SOLN
10.0000 mg | Freq: Once | INTRAMUSCULAR | Status: AC
  Administered 2019-03-01: 18:00:00 10 mg via INTRAMUSCULAR
  Filled 2019-03-01: qty 1

## 2019-03-01 MED ORDER — FLUTICASONE PROPIONATE 50 MCG/ACT NA SUSP: 2.0000 | Freq: Every day | NASAL | 0 refills | Status: AC

## 2019-03-01 NOTE — ED Triage Notes (Signed)
PT reports several days of RT ear pain and throat pain when swallowing. Pt reports seeing drainage from ear on pillow.

## 2019-03-01 NOTE — ED Notes (Signed)
Patient verbalizes understanding of discharge instructions . Opportunity for questions and answers were provided . Armband removed by staff ,Pt discharged from ED. W/C  offered at D/C  and Declined W/C at D/C and was escorted to lobby by RN.  

## 2019-03-01 NOTE — Discharge Instructions (Signed)
Today you were diagnosed with strep throat. You were given antibiotics to treat this. You may alternate taking Tylenol and Ibuprofen as needed for pain control. You may take 400-600 mg of ibuprofen every 6 hours and [phone] mg of Tylenol every 6 hours. Do not exceed 4000 mg of Tylenol daily as this can lead to liver damage. Also, make sure to take Ibuprofen with meals as it can cause an upset stomach. Do not take other NSAIDs while taking Ibuprofen such as (Aleve, Naprosyn, Aspirin, Celebrex, etc) and do not take more than the prescribed dose as this can lead to ulcers and bleeding in your GI tract. You may use warm and cold compresses to help with your symptoms.   Please follow up with your primary doctor within the next 7-10 days for re-evaluation and further treatment of your symptoms.   Please return to the ER sooner if you have any new or worsening symptoms.
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
## 2019-10-21 ENCOUNTER — Encounter (HOSPITAL_COMMUNITY): Payer: Self-pay

## 2019-10-21 ENCOUNTER — Other Ambulatory Visit: Payer: Self-pay

## 2019-10-21 ENCOUNTER — Ambulatory Visit (HOSPITAL_COMMUNITY)
Admission: EM | Admit: 2019-10-21 | Discharge: 2019-10-21 | Disposition: A | Discharge status: Home / Self Care | Payer: HRSA Program | Attending: Family Medicine | Admitting: Family Medicine

## 2019-10-21 DIAGNOSIS — Z20822 Contact with and (suspected) exposure to covid-19: Secondary | ICD-10-CM | POA: Insufficient documentation

## 2019-10-21 NOTE — Discharge Instructions (Addendum)
We have tested you for COVID  Go home and quarantine until we get our results.  You can take OTC medications as needed.   
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
## 2019-10-21 NOTE — ED Triage Notes (Signed)
Pt requests COVID testing and states her father just tested positive for COVID today. Pt denies URI sx, abdom pain, n/v/d, fever, chills.

## 2019-10-22 LAB — SARS CORONAVIRUS 2 (TAT 6-24 HRS): SARS Coronavirus 2: NEGATIVE

## 2020-04-19 ENCOUNTER — Emergency Department (HOSPITAL_COMMUNITY)
Admission: EM | Admit: 2020-04-19 | Discharge: 2020-04-20 | Disposition: A | Discharge status: Home / Self Care | Payer: Self-pay | Attending: Emergency Medicine | Admitting: Emergency Medicine

## 2020-04-19 ENCOUNTER — Encounter (HOSPITAL_COMMUNITY): Payer: Self-pay | Admitting: Emergency Medicine

## 2020-04-19 DIAGNOSIS — Z5321 Procedure and treatment not carried out due to patient leaving prior to being seen by health care provider: Secondary | ICD-10-CM | POA: Insufficient documentation

## 2020-04-19 DIAGNOSIS — R569 Unspecified convulsions: Secondary | ICD-10-CM | POA: Insufficient documentation

## 2020-04-19 DIAGNOSIS — S01511A Laceration without foreign body of lip, initial encounter: Secondary | ICD-10-CM | POA: Insufficient documentation
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
## 2020-04-20 ENCOUNTER — Emergency Department (HOSPITAL_COMMUNITY)
Admission: EM | Admit: 2020-04-20 | Discharge: 2020-04-20 | Disposition: A | Discharge status: Home / Self Care | Payer: Self-pay | Attending: Emergency Medicine | Admitting: Emergency Medicine

## 2020-04-20 ENCOUNTER — Other Ambulatory Visit: Payer: Self-pay

## 2020-04-20 ENCOUNTER — Encounter (HOSPITAL_COMMUNITY): Payer: Self-pay | Admitting: Pharmacy Technician

## 2020-04-20 DIAGNOSIS — S01511A Laceration without foreign body of lip, initial encounter: Secondary | ICD-10-CM | POA: Insufficient documentation

## 2020-04-20 DIAGNOSIS — J45909 Unspecified asthma, uncomplicated: Secondary | ICD-10-CM | POA: Insufficient documentation

## 2020-04-20 DIAGNOSIS — F17210 Nicotine dependence, cigarettes, uncomplicated: Secondary | ICD-10-CM | POA: Insufficient documentation

## 2020-04-20 MED ORDER — AMOXICILLIN-POT CLAVULANATE 875-125 MG PO TABS
1.0000 | ORAL_TABLET | Freq: Once | ORAL | Status: AC
  Administered 2020-04-20: 1 via ORAL
  Filled 2020-04-20: qty 1

## 2020-04-20 MED ORDER — AMOXICILLIN-POT CLAVULANATE 875-125 MG PO TABS: 1.0000 | ORAL_TABLET | Freq: Two times a day (BID) | ORAL | 0 refills | Status: AC
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
## 2020-04-20 NOTE — Discharge Instructions (Addendum)
Please take antibiotics as prescribed twice daily for the next week.  Please drink plenty of water.  Keep an eye on the area and make sure that the infection is improving. Please use Tylenol or ibuprofen for pain.  You may use 600 mg ibuprofen every 6 hours or 1000 mg of Tylenol every 6 hours.  You may choose to alternate between the 2.  This would be most effective.  Not to exceed 4 g of Tylenol within 24 hours.  Not to exceed 3200 mg ibuprofen 24 hours.

## 2020-04-20 NOTE — ED Notes (Signed)
Pt called 3x no answer  
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
## 2020-11-18 ENCOUNTER — Emergency Department (HOSPITAL_COMMUNITY)
Admission: EM | Admit: 2020-11-18 | Discharge: 2020-11-18 | Disposition: A | Discharge status: Home / Self Care | Payer: Self-pay | Attending: Emergency Medicine | Admitting: Emergency Medicine

## 2020-11-18 ENCOUNTER — Other Ambulatory Visit: Payer: Self-pay

## 2020-11-18 DIAGNOSIS — G40909 Epilepsy, unspecified, not intractable, without status epilepticus: Secondary | ICD-10-CM | POA: Insufficient documentation

## 2020-11-18 DIAGNOSIS — J45909 Unspecified asthma, uncomplicated: Secondary | ICD-10-CM | POA: Insufficient documentation

## 2020-11-18 DIAGNOSIS — F10120 Alcohol abuse with intoxication, uncomplicated: Secondary | ICD-10-CM | POA: Insufficient documentation

## 2020-11-18 DIAGNOSIS — F10920 Alcohol use, unspecified with intoxication, uncomplicated: Secondary | ICD-10-CM

## 2020-11-18 DIAGNOSIS — Y904 Blood alcohol level of 80-99 mg/100 ml: Secondary | ICD-10-CM | POA: Insufficient documentation

## 2020-11-18 DIAGNOSIS — F17210 Nicotine dependence, cigarettes, uncomplicated: Secondary | ICD-10-CM | POA: Insufficient documentation

## 2020-11-18 LAB — COMPREHENSIVE METABOLIC PANEL
ALT: 12 U/L (ref 0–44)
AST: 20 U/L (ref 15–41)
Albumin: 4.5 g/dL (ref 3.5–5.0)
Alkaline Phosphatase: 67 U/L (ref 38–126)
Anion gap: 14 (ref 5–15)
BUN: 5 mg/dL — ABNORMAL LOW (ref 6–20)
CO2: 21 mmol/L — ABNORMAL LOW (ref 22–32)
Calcium: 9.8 mg/dL (ref 8.9–10.3)
Chloride: 103 mmol/L (ref 98–111)
Creatinine, Ser: 0.85 mg/dL (ref 0.44–1.00)
GFR, Estimated: >60 mL/min (ref >60)
Glucose, Bld: 79 mg/dL (ref 70–99)
Potassium: 3.5 mmol/L (ref 3.5–5.1)
Sodium: 138 mmol/L (ref 135–145)
Total Bilirubin: 0.7 mg/dL (ref 0.3–1.2)
Total Protein: 8.1 g/dL (ref 6.5–8.1)

## 2020-11-18 LAB — CBC
HCT: 44.5 % (ref 36.0–46.0)
Hemoglobin: 14.3 g/dL (ref 12.0–15.0)
MCH: 29.6 pg (ref 26.0–34.0)
MCHC: 32.1 g/dL (ref 30.0–36.0)
MCV: 92.1 fL (ref 80.0–100.0)
Platelets: 255 10*3/uL (ref 150–400)
RBC: 4.83 MIL/uL (ref 3.87–5.11)
RDW: 13.1 % (ref 11.5–15.5)
WBC: 6.3 10*3/uL (ref 4.0–10.5)
nRBC: 0 % (ref 0.0–0.2)

## 2020-11-18 LAB — ETHANOL: Alcohol, Ethyl (B): 80 mg/dL — ABNORMAL HIGH (ref <10)

## 2020-11-18 NOTE — ED Notes (Signed)
Patient responsive to Clinical research associate. Reports history of seizures. Unknown how much alcohol was consumed. Reports non-compliance with seizure medications.

## 2020-11-18 NOTE — ED Provider Notes (Signed)
[facility] Emergency Department Provider Note MRN:  [id number]  Arrival date & time: 11/18/20     Chief Complaint   Seizure History of Present Illness   Emily Massey is a 25 y.o. year-old adult with a history of pseudoseizures, depression presenting to the ED with chief complaint of seizure.  Patient found on the floor by roommate, possible alcohol use.  Possible seizure activity.  I was unable to obtain an accurate HPI, PMH, or ROS due to the patient's altered mental status.  Level 5 caveat.  Review of Systems  Positive for found down, seizure activity.  Patient's Health History    Past Medical History:  Diagnosis Date  . Anemia   . Anxiety   . Asthma   . Depression   . Headache   . Mental disorder   . Seizures (HCC)     Past Surgical History:  Procedure Laterality Date  . EXTERNAL EAR SURGERY    . FOREIGN BODY REMOVAL Right 03/16/2013   Procedure: REMOVAL FOREIGN BODY EXTREMITY;  Surgeon: Sharma Covert, MD;  Location: [facility] OR;  Service: Orthopedics;  Laterality: Right;  . HAND SURGERY      Family History  Problem Relation Age of Onset  . Healthy Mother   . Healthy Father     Social History   Socioeconomic History  . Marital status: Significant Other    Spouse name: Not on file  . Number of children: Not on file  . Years of education: Not on file  . Highest education level: Not on file  Occupational History  . Not on file  Tobacco Use  . Smoking status: Current Every Day Smoker    Packs/day: 1.00    Years: 1.00    Pack years: 1.00    Types: Cigarettes  . Smokeless tobacco: Never Used  Substance and Sexual Activity  . Alcohol use: Yes    Comment: Occasionally   . Drug use: Yes    Types: Marijuana    Comment: "couple times per month"  . Sexual activity: Yes    Comment: lesbian so does not use birth control  Other Topics Concern  . Not on file  Social History Narrative   ** Merged History Encounter **       Social  Determinants of Health   Financial Resource Strain: Not on file  Food Insecurity: Not on file  Transportation Needs: Not on file  Physical Activity: Not on file  Stress: Not on file  Social Connections: Not on file  Intimate Partner Violence: Not on file     Physical Exam   Vitals:   11/18/20 0200 11/18/20 0245  BP: 108/72 110/65  Pulse: 71 79  Resp: 16 16  Temp:    SpO2: 100% 99%    CONSTITUTIONAL: Well-appearing, NAD NEURO: Wakes easily to voice, follows commands, conversant, inebriated EYES:  eyes equal and reactive ENT/NECK:  no LAD, no JVD CARDIO: Regular rate, well-perfused, normal S1 and S2 PULM:  CTAB no wheezing or rhonchi GI/GU:  normal bowel sounds, non-distended, non-tender MSK/SPINE:  No gross deformities, no edema SKIN:  no rash, atraumatic PSYCH:  Appropriate speech and behavior  *Additional and/or pertinent findings included in MDM below  Diagnostic and Interventional Summary    EKG Interpretation  Date/Time:  Thursday Nov 18 2020 01:14:26 EDT Ventricular Rate:  84 PR Interval:  136 QRS Duration: 68 QT Interval:  325 QTC Calculation: 385 R Axis:   65 Text Interpretation: Sinus rhythm Confirmed by Kennis Carina (  [id number]) on 11/18/2020 3:23:42 AM      Labs Reviewed  COMPREHENSIVE METABOLIC PANEL - Abnormal; Notable for the following components:      Result Value   CO2 21 (*)    BUN 5 (*)    All other components within normal limits  ETHANOL - Abnormal; Notable for the following components:   Alcohol, Ethyl (B) 80 (*)    All other components within normal limits  CBC    No orders to display    Medications - No data to display   Procedures  /  Critical Care Procedures  ED Course and Medical Decision Making  I have reviewed the triage vital signs, the nursing notes, and pertinent available records from the EMR.  Listed above are laboratory and imaging tests that I personally ordered, reviewed, and interpreted and then considered in my  medical decision making (see below for details).  Suspect alcohol intoxication, possibly some nonepileptic seizure activity today, well-documented that patient has a history of this.  Vital signs normal, resting comfortably, will check screening labs and monitor in the emergency department.     Labs reassuring, continues to look and feel well, normal neuro exam, no indication for CNS imaging, appropriate for discharge.  Elmer Sow. Pilar Plate, MD [facility] Emergency Medicine [facility] [email or web address]  Final Clinical Impressions(s) / ED Diagnoses     ICD-10-CM   1. Alcoholic intoxication without complication (HCC)  D63.875     ED Discharge Orders    None       Discharge Instructions Discussed with and Provided to Patient:     Discharge Instructions     You were evaluated in the Emergency Department and after careful evaluation, we did not find any emergent condition requiring admission or further testing in the hospital.  Your exam/testing today was overall reassuring.  Please return to the Emergency Department if you experience any worsening of your condition.  Thank you for allowing [place] to be a part of your care.        Sabas Sous, MD 11/18/20 [id number]

## 2020-11-18 NOTE — Discharge Instructions (Addendum)
You were evaluated in the Emergency Department and after careful evaluation, we did not find any emergent condition requiring admission or further testing in the hospital.  Your exam/testing today was overall reassuring.  Please return to the Emergency Department if you experience any worsening of your condition.  Thank you for allowing us to be a part of your care.  

## 2020-11-18 NOTE — ED Triage Notes (Incomplete)
BIBA. Found on floor at friends house. Has a hx of pseudoseizures Per medic, pt c/o head pain, no oral trauma. Possible ETOH. Patient states that he doesn't take medication for seizures.

## 2020-12-04 ENCOUNTER — Emergency Department (HOSPITAL_COMMUNITY)
Admission: EM | Admit: 2020-12-04 | Discharge: 2020-12-04 | Disposition: A | Discharge status: Home / Self Care | Payer: Medicaid Other | Attending: Emergency Medicine | Admitting: Emergency Medicine

## 2020-12-04 ENCOUNTER — Other Ambulatory Visit: Payer: Self-pay

## 2020-12-04 DIAGNOSIS — R519 Headache, unspecified: Secondary | ICD-10-CM | POA: Insufficient documentation

## 2020-12-04 DIAGNOSIS — J45909 Unspecified asthma, uncomplicated: Secondary | ICD-10-CM | POA: Insufficient documentation

## 2020-12-04 DIAGNOSIS — F17210 Nicotine dependence, cigarettes, uncomplicated: Secondary | ICD-10-CM | POA: Insufficient documentation

## 2020-12-04 DIAGNOSIS — Z7952 Long term (current) use of systemic steroids: Secondary | ICD-10-CM | POA: Insufficient documentation

## 2020-12-04 MED ORDER — ACETAMINOPHEN 500 MG PO TABS: 1000.0000 mg | ORAL_TABLET | Freq: Four times a day (QID) | ORAL | 0 refills | Status: AC | PRN

## 2020-12-04 MED ORDER — ACETAMINOPHEN 500 MG PO TABS: 1000.0000 mg | ORAL_TABLET | Freq: Four times a day (QID) | ORAL | 0 refills | Status: DC | PRN

## 2020-12-04 MED ORDER — ACETAMINOPHEN 500 MG PO TABS
1000.0000 mg | ORAL_TABLET | Freq: Once | ORAL | Status: AC
  Administered 2020-12-04: 1000 mg via ORAL
  Filled 2020-12-04: qty 2

## 2020-12-04 NOTE — ED Notes (Signed)
Pt ambulatory with steady gait. Pt speech clear, pt oriented X4

## 2020-12-04 NOTE — Discharge Instructions (Addendum)
Take tylenol as needed for headache.  Get plenty of rest.  Follow up with your doctor for further care.

## 2020-12-04 NOTE — ED Triage Notes (Signed)
Pt arrives via EMS with complaints of seizure like activity. EMS reports pt experiencing excess stress. Pt girlfriend called EMS d/t pt "shaking" EMS reports pt was A/O X4  Non compliant with psych meds. No history of seizure.    BP138/80 HR 70 CBG 102 T 98.7

## 2020-12-04 NOTE — ED Notes (Signed)
Pt reports taking muscle relaxer and xanax yesterday

## 2020-12-04 NOTE — ED Provider Notes (Signed)
[facility] EMERGENCY DEPARTMENT Provider Note   CSN: [id number] Arrival date & time: 12/04/20  1252     History No chief complaint on file.   Zuzanna Maroney is a 25 y.o. adult.  The history is provided by the patient and medical records. No language interpreter was used.     25 year old female with history of pseudoseizure, PTSD, depression, brought here via EMS with concerns of seizure activity.  Patient endorsed having bilateral temporal throbbing headache ongoing for the past 4 to 5 days.  History also report taking some muscle relaxant as well as some Xanax yesterday to help her with her headache.  She endorsed having excessive stress that she has been dealing with and family member noticed that patient was shaking today thus prompting this ER visit.  She has not been taking her psychiatric medication but denies any SI or HI or report of any hallucination.  She does admits to having history of pseudoseizure without any specific treatment.  She denies any tongue biting or bowel bladder incontinence.  She denies any cold symptoms, no fever chills neck stiffness chest pain shortness of breath focal numbness or focal weakness.  She denies any drug use aside from Tylenol and marijuana use.  Past Medical History:  Diagnosis Date  . Anemia   . Anxiety   . Asthma   . Depression   . Headache   . Mental disorder   . Seizures [facility])     Patient Active Problem List   Diagnosis Date Noted  . Attention deficit hyperactivity disorder (ADHD) 07/16/2015  . Tobacco use disorder 07/16/2015  . Borderline personality disorder (HCC) 07/16/2015  . Asthma 07/16/2015  . Cannabis use disorder, mild, abuse 07/16/2015  . History of pseudoseizure 07/16/2015  . Injury, self-inflicted 03/15/2013  . Post traumatic stress disorder (PTSD) 03/04/2013    Past Surgical History:  Procedure Laterality Date  . EXTERNAL EAR SURGERY    . FOREIGN BODY REMOVAL Right 03/16/2013   Procedure:  REMOVAL FOREIGN BODY EXTREMITY;  Surgeon: Sharma Covert, MD;  Location: [facility];  Service: Orthopedics;  Laterality: Right;  . HAND SURGERY       OB History    Gravida  0   Para  0   Term  0   Preterm  0   AB  0   Living        SAB  0   IAB  0   Ectopic  0   Multiple      Live Births              Family History  Problem Relation Age of Onset  . Healthy Mother   . Healthy Father     Social History   Tobacco Use  . Smoking status: Current Every Day Smoker    Packs/day: 1.00    Years: 1.00    Pack years: 1.00    Types: Cigarettes  . Smokeless tobacco: Never Used  Substance Use Topics  . Alcohol use: Yes    Comment: Occasionally   . Drug use: Yes    Types: Marijuana    Comment: "couple times per month"    Home Medications Prior to Admission medications   Medication Sig Start Date End Date Taking? Authorizing Provider  albuterol (PROVENTIL HFA;VENTOLIN HFA) 108 (90 BASE) MCG/ACT inhaler Inhale 2 puffs into the lungs every 6 (six) hours as needed for wheezing.  03/07/13   Winson, Louie Bun, NP  fluticasone (FLONASE) 50 MCG/ACT nasal spray  Place 2 sprays into both nostrils daily. 03/01/19   Couture, Cortni S, PA-C  polyethylene glycol (MIRALAX) packet Take 17 g by mouth daily. Patient not taking: Reported on 05/21/2017 09/21/16   Vanetta Mulders, MD    Allergies    Bee pollen-1000-royal jelly [nutritional supplements], Bee venom, and Mushroom extract complex  Review of Systems   Review of Systems  All other systems reviewed and are negative.   Physical Exam Updated Vital Signs BP 108/78 (BP Location: Right Arm)   Pulse 78   Temp 98.4 F (36.9 C) (Oral)   Resp 16   Ht 5\' 8"  (1.727 m)   Wt 68 kg   SpO2 100%   BMI 22.81 kg/m   Physical Exam Constitutional:      General: He is not in acute distress.    Appearance: He is well-developed.  HENT:     Head: Atraumatic.  Eyes:     Extraocular Movements: Extraocular movements intact.      Conjunctiva/sclera: Conjunctivae normal.     Pupils: Pupils are equal, round, and reactive to light.  Cardiovascular:     Rate and Rhythm: Normal rate and regular rhythm.     Pulses: Normal pulses.     Heart sounds: Normal heart sounds.  Pulmonary:     Effort: Pulmonary effort is normal.     Breath sounds: Normal breath sounds.  Abdominal:     Palpations: Abdomen is soft.     Tenderness: There is no abdominal tenderness.  Musculoskeletal:     Cervical back: Normal range of motion and neck supple. No rigidity.     Comments: 5/5 strength all 4 extremities  Skin:    Findings: No rash.  Neurological:     Mental Status: He is alert. Mental status is at baseline.  Psychiatric:        Mood and Affect: Mood normal.     ED Results / Procedures / Treatments   Labs (all labs ordered are listed, but only abnormal results are displayed) Labs Reviewed - No data to display  EKG None  Radiology No results found.  Procedures Procedures   Medications Ordered in ED Medications  acetaminophen (TYLENOL) tablet 1,000 mg (has no administration in time range)    ED Course  I have reviewed the triage vital signs and the nursing notes.  Pertinent labs & imaging results that were available during my care of the patient were reviewed by me and considered in my medical decision making (see chart for details).    MDM Rules/Calculators/A&P                          BP 108/78 (BP Location: Right Arm)   Pulse 78   Temp 98.4 F (36.9 C) (Oral)   Resp 16   Ht 5\' 8"  (1.727 m)   Wt 68 kg   SpO2 100%   BMI 22.81 kg/m   Final Clinical Impression(s) / ED Diagnoses Final diagnoses:  Bad headache    Rx / DC Orders ED Discharge Orders         Ordered    acetaminophen (TYLENOL) 500 MG tablet  Every 6 hours PRN        12/04/20 1411         1:12 PM Patient with history of pseudoseizure.  With shakiness and ongoing headache for the past 5 days.  Her symptoms not consistent with seizure.   Does have history of psychiatric illness not compliant with  her medication however denies any SI HI or hallucination.  She is vital signs stable, she is overall well.  She does not have any nuchal rigidity concerning for meningitis does not have any focal neurodeficits or any other concerning finding on exam.  Tylenol given for headache.  2:13 PM Patient significant other is now at bedside report that they have a verbal altercation earlier today which stressed patient out in some time patient will develop seizure activities when stressed.  On exam patient does not have any significant signs of head injury no any other concerning feature.  Reassurance given, patient stable for discharge.   Fayrene Helper, PA-C 12/04/20 1413    Terrilee Files, MD 12/04/20 2236

## 2021-03-21 ENCOUNTER — Emergency Department (HOSPITAL_COMMUNITY)
Admission: EM | Admit: 2021-03-21 | Discharge: 2021-03-21 | Disposition: A | Discharge status: Home / Self Care | Payer: Medicaid Other | Attending: Emergency Medicine | Admitting: Emergency Medicine

## 2021-03-21 ENCOUNTER — Other Ambulatory Visit: Payer: Self-pay

## 2021-03-21 ENCOUNTER — Ambulatory Visit (HOSPITAL_COMMUNITY)
Admission: EM | Admit: 2021-03-21 | Discharge: 2021-03-21 | Disposition: A | Discharge status: Home / Self Care | Payer: Medicaid Other | Attending: Emergency Medicine | Admitting: Emergency Medicine

## 2021-03-21 ENCOUNTER — Encounter (HOSPITAL_COMMUNITY): Payer: Self-pay | Admitting: *Deleted

## 2021-03-21 DIAGNOSIS — R110 Nausea: Secondary | ICD-10-CM | POA: Insufficient documentation

## 2021-03-21 DIAGNOSIS — Z5321 Procedure and treatment not carried out due to patient leaving prior to being seen by health care provider: Secondary | ICD-10-CM | POA: Insufficient documentation

## 2021-03-21 DIAGNOSIS — K529 Noninfective gastroenteritis and colitis, unspecified: Secondary | ICD-10-CM

## 2021-03-21 DIAGNOSIS — R1032 Left lower quadrant pain: Secondary | ICD-10-CM | POA: Insufficient documentation

## 2021-03-21 DIAGNOSIS — R509 Fever, unspecified: Secondary | ICD-10-CM | POA: Insufficient documentation

## 2021-03-21 DIAGNOSIS — R197 Diarrhea, unspecified: Secondary | ICD-10-CM | POA: Insufficient documentation

## 2021-03-21 LAB — COMPREHENSIVE METABOLIC PANEL
ALT: 11 U/L (ref 0–44)
AST: 16 U/L (ref 15–41)
Albumin: 4 g/dL (ref 3.5–5.0)
Alkaline Phosphatase: 66 U/L (ref 38–126)
Anion gap: 13 (ref 5–15)
BUN: <5 mg/dL — ABNORMAL LOW (ref 6–20)
CO2: 23 mmol/L (ref 22–32)
Calcium: 8.7 mg/dL — ABNORMAL LOW (ref 8.9–10.3)
Chloride: 99 mmol/L (ref 98–111)
Creatinine, Ser: 0.8 mg/dL (ref 0.44–1.00)
GFR, Estimated: >60 mL/min (ref >60)
Glucose, Bld: 84 mg/dL (ref 70–99)
Potassium: 2.9 mmol/L — ABNORMAL LOW (ref 3.5–5.1)
Sodium: 135 mmol/L (ref 135–145)
Total Bilirubin: 1.1 mg/dL (ref 0.3–1.2)
Total Protein: 7.3 g/dL (ref 6.5–8.1)

## 2021-03-21 LAB — CBC WITH DIFFERENTIAL/PLATELET
Abs Immature Granulocytes: 0 10*3/uL (ref 0.00–0.07)
Basophils Absolute: 0 10*3/uL (ref 0.0–0.1)
Basophils Relative: 0 %
Eosinophils Absolute: 0 10*3/uL (ref 0.0–0.5)
Eosinophils Relative: 0 %
HCT: 43.1 % (ref 36.0–46.0)
Hemoglobin: 14.4 g/dL (ref 12.0–15.0)
Lymphocytes Relative: 28 %
Lymphs Abs: 1.6 10*3/uL (ref 0.7–4.0)
MCH: 28.7 pg (ref 26.0–34.0)
MCHC: 33.4 g/dL (ref 30.0–36.0)
MCV: 85.9 fL (ref 80.0–100.0)
Monocytes Absolute: 0.2 10*3/uL (ref 0.1–1.0)
Monocytes Relative: 3 %
Neutro Abs: 4 10*3/uL (ref 1.7–7.7)
Neutrophils Relative %: 69 %
Platelets: 278 10*3/uL (ref 150–400)
RBC: 5.02 MIL/uL (ref 3.87–5.11)
RDW: 13.2 % (ref 11.5–15.5)
WBC: 5.8 10*3/uL (ref 4.0–10.5)
nRBC: 0 % (ref 0.0–0.2)
nRBC: 0 /100 WBC

## 2021-03-21 LAB — URINALYSIS, MICROSCOPIC (REFLEX): WBC, UA: >50 WBC/hpf (ref 0–5)

## 2021-03-21 LAB — LIPASE, BLOOD: Lipase: 20 U/L (ref 11–51)

## 2021-03-21 LAB — I-STAT BETA HCG BLOOD, ED (MC, WL, AP ONLY): I-stat hCG, quantitative: <5 m[IU]/mL (ref <5)

## 2021-03-21 LAB — URINALYSIS, ROUTINE W REFLEX MICROSCOPIC
Glucose, UA: NEGATIVE mg/dL
Ketones, ur: >80 mg/dL — AB
Nitrite: NEGATIVE
Protein, ur: NEGATIVE mg/dL
Specific Gravity, Urine: 1.025 (ref 1.005–1.030)
pH: 6.5 (ref 5.0–8.0)

## 2021-03-21 MED ORDER — ONDANSETRON 4 MG PO TBDP: 4.0000 mg | ORAL_TABLET | Freq: Three times a day (TID) | ORAL | 0 refills | Status: AC | PRN

## 2021-03-21 MED ORDER — CIPROFLOXACIN HCL 500 MG PO TABS: 500.0000 mg | ORAL_TABLET | Freq: Two times a day (BID) | ORAL | 0 refills | Status: AC

## 2021-03-21 MED ORDER — METRONIDAZOLE 500 MG PO TABS: 500.0000 mg | ORAL_TABLET | Freq: Two times a day (BID) | ORAL | 0 refills | Status: AC

## 2021-03-21 NOTE — Discharge Instructions (Signed)
Take the cipro 1 pill twice a day for the next 5 days.  Take the Flagyl 1 pill twice a day for the next 5 days.   You can take the zofran as needed for nausea and vomiting.   Make sure you are drinking plenty of fluids, especially water.  You can also drink pedialyte, powerade, gatorade, or other exercise drinks but try to avoid anything with a lot of sugar.    If your symptoms worsen or do not improve in the next few days, please go to the ED for further evaluation and imaging.    If you develop the worse headache of your life, worsening dizziness, nausea/vomiting, blurred vision, slurred speech, difficulty walking, weakness on one side, chest pain, shortness of breath, or altered mental status, call 911 or go directly to the Emergency Department for further evaluation.

## 2021-03-21 NOTE — ED Triage Notes (Signed)
Pt left [place] ED just now because of wait. Pt reports LUQ pain 10/10 with blood in stool.

## 2021-03-21 NOTE — ED Notes (Signed)
Pt called for vitals. No response.  

## 2021-03-21 NOTE — ED Triage Notes (Signed)
Pt arrived complaining of left abdominal pain and bloody diarrhea for past couple of days Pt states they have used OTC pain meds and heat pads without relief.

## 2021-03-21 NOTE — ED Provider Notes (Signed)
Emergency Medicine Provider Triage Evaluation Note  Emily Massey , a 25 y.o. adult  was evaluated in triage.  Pt complains of bloody diarrhea that started 4 days ago.  She says that has not gotten any better.  It has just gotten worse.  She is taken Tylenol with no relief.  She is associated left lower quadrant abdominal pain.  She does have some nausea but no vomiting.  She has had some subjective fevers.  She has not been around anybody else that is sick.  Review of Systems  Positive: Bloody diarrhea, nausea, left lower quadrant abdominal pain Negative: Chest pain, shortness of breath, vomiting, urinary symptoms  Physical Exam  BP (!) 155/87 (BP Location: Right Arm)   Pulse 96   Temp 98.1 F (36.7 C)   Resp 16   Ht 5\' 6"  (1.676 m)   Wt 68.9 kg   SpO2 100%   BMI 24.53 kg/m  Gen:   Awake, no distress   Resp:  Normal effort  MSK:   Moves extremities without difficulty  Other:  Localized left lower quadrant abdominal tenderness.  No bruising noted.  Medical Decision Making  Medically screening exam initiated at 12:21 PM.  Appropriate orders placed.  Emily Massey was informed that the remainder of the evaluation will be completed by another provider, this initial triage assessment does not replace that evaluation, and the importance of remaining in the ED until their evaluation is complete.     [name], PA-C 03/21/21 1228    05/21/21, DO 03/21/21 1752

## 2021-03-21 NOTE — ED Provider Notes (Signed)
[facility]    CSN: [id number] Arrival date & time: 03/21/21  1520      History   Chief Complaint Chief Complaint  Patient presents with   Abdominal Pain   Rectal Bleeding    HPI Emily Massey is a 25 y.o. adult.   Patient here for evaluation of left-sided abdominal pain and bloody diarrhea that has been ongoing for the past 3 to 4 days.  Reports some nausea but has not had any vomiting.  Patient states that blood is mixed in with diarrhea and is a dark burgundy color.  Denies any constipation.  Denies any fevers.  Has not taken any OTC medications or treatments.  Denies any history of ulcerative colitis or Crohn's disease.  Denies any trauma, injury, or other precipitating event.  Denies any specific alleviating or aggravating factors.  Denies any fevers, chest pain, shortness of breath, numbness, tingling, weakness,or headaches.    The history is provided by the patient.  Abdominal Pain Associated symptoms: diarrhea, hematochezia and nausea   Associated symptoms: no constipation and no vomiting   Rectal Bleeding Associated symptoms: abdominal pain   Associated symptoms: no vomiting    Past Medical History:  Diagnosis Date   Anemia    Anxiety    Asthma    Depression    Headache    Mental disorder    Seizures (HCC)     Patient Active Problem List   Diagnosis Date Noted   Attention deficit hyperactivity disorder (ADHD) 07/16/2015   Tobacco use disorder 07/16/2015   Borderline personality disorder (HCC) 07/16/2015   Asthma 07/16/2015   Cannabis use disorder, mild, abuse 07/16/2015   History of pseudoseizure 07/16/2015   Injury, self-inflicted 03/15/2013   Post traumatic stress disorder (PTSD) 03/04/2013    Past Surgical History:  Procedure Laterality Date   EXTERNAL EAR SURGERY     FOREIGN BODY REMOVAL Right 03/16/2013   Procedure: REMOVAL FOREIGN BODY EXTREMITY;  Surgeon: Sharma Covert, MD;  Location: [facility];  Service: Orthopedics;  Laterality:  Right;   HAND SURGERY      OB History     Gravida  0   Para  0   Term  0   Preterm  0   AB  0   Living         SAB  0   IAB  0   Ectopic  0   Multiple      Live Births               Home Medications    Prior to Admission medications   Medication Sig Start Date End Date Taking? Authorizing Provider  ciprofloxacin (CIPRO) 500 MG tablet Take 1 tablet (500 mg total) by mouth every 12 (twelve) hours. 03/21/21  Yes Ivette Loyal, NP  metroNIDAZOLE (FLAGYL) 500 MG tablet Take 1 tablet (500 mg total) by mouth 2 (two) times daily for 5 days. 03/21/21 03/26/21 Yes Ivette Loyal, NP  ondansetron (ZOFRAN ODT) 4 MG disintegrating tablet Take 1 tablet (4 mg total) by mouth every 8 (eight) hours as needed for nausea or vomiting. 03/21/21  Yes Ivette Loyal, NP  acetaminophen (TYLENOL) 500 MG tablet Take 2 tablets (1,000 mg total) by mouth every 6 (six) hours as needed for headache. 12/04/20   Fayrene Helper, PA-C  albuterol (PROVENTIL HFA;VENTOLIN HFA) 108 (90 BASE) MCG/ACT inhaler Inhale 2 puffs into the lungs every 6 (six) hours as needed for wheezing.  03/07/13   Trinda Pascal  B, NP  fluticasone (FLONASE) 50 MCG/ACT nasal spray Place 2 sprays into both nostrils daily. 03/01/19   Couture, Cortni S, PA-C  polyethylene glycol (MIRALAX) packet Take 17 g by mouth daily. Patient not taking: Reported on 05/21/2017 09/21/16   Vanetta Mulders, MD    Family History Family History  Problem Relation Age of Onset   Healthy Mother    Healthy Father     Social History Social History   Tobacco Use   Smoking status: Every Day    Packs/day: 1.00    Years: 1.00    Pack years: 1.00    Types: Cigarettes   Smokeless tobacco: Never  Substance Use Topics   Alcohol use: Yes    Comment: Occasionally    Drug use: Yes    Types: Marijuana    Comment: "couple times per month"     Allergies   Bee pollen-1000-royal jelly [nutritional supplements], Bee venom, Gabapentin, Mushroom extract  complex, and Tramadol   Review of Systems Review of Systems  Gastrointestinal:  Positive for abdominal pain, blood in stool, diarrhea, hematochezia and nausea. Negative for constipation and vomiting.  All other systems reviewed and are negative.   Physical Exam Triage Vital Signs ED Triage Vitals  Enc Vitals Group     BP 03/21/21 1602 (!) 152/93     Pulse Rate 03/21/21 1602 88     Resp 03/21/21 1602 18     Temp 03/21/21 1602 98.2 F (36.8 C)     Temp src --      SpO2 03/21/21 1602 98 %     Weight --      Height --      Head Circumference --      Peak Flow --      Pain Score 03/21/21 1600 10     Pain Loc --      Pain Edu? --      Excl. in GC? --    No data found.  Updated Vital Signs BP (!) 152/93   Pulse 88   Temp 98.2 F (36.8 C)   Resp 18   SpO2 98%   Visual Acuity Right Eye Distance:   Left Eye Distance:   Bilateral Distance:    Right Eye Near:   Left Eye Near:    Bilateral Near:     Physical Exam Vitals and nursing note reviewed.  Constitutional:      General: He is not in acute distress.    Appearance: Normal appearance. He is not ill-appearing, toxic-appearing or diaphoretic.  HENT:     Head: Normocephalic and atraumatic.  Eyes:     Conjunctiva/sclera: Conjunctivae normal.  Cardiovascular:     Rate and Rhythm: Normal rate.     Pulses: Normal pulses.  Pulmonary:     Effort: Pulmonary effort is normal.  Abdominal:     General: Abdomen is flat.     Tenderness: There is abdominal tenderness in the left lower quadrant. There is no right CVA tenderness, left CVA tenderness or rebound. Negative signs include Murphy's sign, Rovsing's sign, McBurney's sign, psoas sign and obturator sign.     Hernia: No hernia is present.  Musculoskeletal:        General: Normal range of motion.     Cervical back: Normal range of motion.  Skin:    General: Skin is warm and dry.  Neurological:     General: No focal deficit present.     Mental Status: He is alert and  oriented to person,  place, and time.  Psychiatric:        Mood and Affect: Mood normal.     UC Treatments / Results  Labs (all labs ordered are listed, but only abnormal results are displayed) Labs Reviewed - No data to display  EKG   Radiology No results found.  Procedures Procedures (including critical care time)  Medications Ordered in UC Medications - No data to display  Initial Impression / Assessment and Plan / UC Course  I have reviewed the triage vital signs and the nursing notes.  Pertinent labs & imaging results that were available during my care of the patient were reviewed by me and considered in my medical decision making (see chart for details).    Reviewed lab work that have been drawn earlier today in the emergency room.  CBC within normal limits.  Likely colitis.  Will treat with Cipro and Flagyl twice daily for the next 5 days.  May take Zofran as needed for nausea and vomiting.  Encourage fluids and rest.  Tylenol and/or ibuprofen as needed.  Strict ED follow-up for any worsening symptoms. Final Clinical Impressions(s) / UC Diagnoses   Final diagnoses:  Colitis     Discharge Instructions      Take the cipro 1 pill twice a day for the next 5 days.  Take the Flagyl 1 pill twice a day for the next 5 days.   You can take the zofran as needed for nausea and vomiting.   Make sure you are drinking plenty of fluids, especially water.  You can also drink pedialyte, powerade, gatorade, or other exercise drinks but try to avoid anything with a lot of sugar.    If your symptoms worsen or do not improve in the next few days, please go to the ED for further evaluation and imaging.    If you develop the worse headache of your life, worsening dizziness, nausea/vomiting, blurred vision, slurred speech, difficulty walking, weakness on one side, chest pain, shortness of breath, or altered mental status, call 911 or go directly to the Emergency Department for further  evaluation.       ED Prescriptions     Medication Sig Dispense Auth. Provider   ciprofloxacin (CIPRO) 500 MG tablet Take 1 tablet (500 mg total) by mouth every 12 (twelve) hours. 10 tablet Ivette Loyal, NP   metroNIDAZOLE (FLAGYL) 500 MG tablet Take 1 tablet (500 mg total) by mouth 2 (two) times daily for 5 days. 10 tablet Ivette Loyal, NP   ondansetron (ZOFRAN ODT) 4 MG disintegrating tablet Take 1 tablet (4 mg total) by mouth every 8 (eight) hours as needed for nausea or vomiting. 20 tablet Ivette Loyal, NP      PDMP not reviewed this encounter.   Ivette Loyal, NP 03/21/21 1630

## 2021-12-25 ENCOUNTER — Emergency Department (HOSPITAL_COMMUNITY)
Admission: EM | Admit: 2021-12-25 | Discharge: 2021-12-25 | Discharge status: Against Medical Advice | Payer: Self-pay | Attending: Emergency Medicine | Admitting: Emergency Medicine

## 2021-12-25 ENCOUNTER — Encounter (HOSPITAL_COMMUNITY): Payer: Self-pay

## 2021-12-25 ENCOUNTER — Other Ambulatory Visit: Payer: Self-pay

## 2021-12-25 DIAGNOSIS — R590 Localized enlarged lymph nodes: Secondary | ICD-10-CM | POA: Insufficient documentation

## 2021-12-25 DIAGNOSIS — Z5329 Procedure and treatment not carried out because of patient's decision for other reasons: Secondary | ICD-10-CM | POA: Insufficient documentation

## 2021-12-25 LAB — CBC WITH DIFFERENTIAL/PLATELET
Abs Immature Granulocytes: 0.01 10*3/uL (ref 0.00–0.07)
Basophils Absolute: 0 10*3/uL (ref 0.0–0.1)
Basophils Relative: 0 %
Eosinophils Absolute: 0 10*3/uL (ref 0.0–0.5)
Eosinophils Relative: 0 %
HCT: 40.6 % (ref 36.0–46.0)
Hemoglobin: 13.1 g/dL (ref 12.0–15.0)
Immature Granulocytes: 0 %
Lymphocytes Relative: 35 %
Lymphs Abs: 2 10*3/uL (ref 0.7–4.0)
MCH: 27.2 pg (ref 26.0–34.0)
MCHC: 32.3 g/dL (ref 30.0–36.0)
MCV: 84.4 fL (ref 80.0–100.0)
Monocytes Absolute: 0.5 10*3/uL (ref 0.1–1.0)
Monocytes Relative: 8 %
Neutro Abs: 3.2 10*3/uL (ref 1.7–7.7)
Neutrophils Relative %: 57 %
Platelets: 277 10*3/uL (ref 150–400)
RBC: 4.81 MIL/uL (ref 3.87–5.11)
RDW: 15.4 % (ref 11.5–15.5)
WBC: 5.7 10*3/uL (ref 4.0–10.5)
nRBC: 0 % (ref 0.0–0.2)

## 2021-12-25 LAB — COMPREHENSIVE METABOLIC PANEL
ALT: 20 U/L (ref 0–44)
AST: 21 U/L (ref 15–41)
Albumin: 3.4 g/dL — ABNORMAL LOW (ref 3.5–5.0)
Alkaline Phosphatase: 75 U/L (ref 38–126)
Anion gap: 7 (ref 5–15)
BUN: <5 mg/dL — ABNORMAL LOW (ref 6–20)
CO2: 24 mmol/L (ref 22–32)
Calcium: 8.6 mg/dL — ABNORMAL LOW (ref 8.9–10.3)
Chloride: 107 mmol/L (ref 98–111)
Creatinine, Ser: 0.93 mg/dL (ref 0.44–1.00)
GFR, Estimated: >60 mL/min (ref >60)
Glucose, Bld: 88 mg/dL (ref 70–99)
Potassium: 3.8 mmol/L (ref 3.5–5.1)
Sodium: 138 mmol/L (ref 135–145)
Total Bilirubin: 0.3 mg/dL (ref 0.3–1.2)
Total Protein: 7.1 g/dL (ref 6.5–8.1)

## 2021-12-25 LAB — HCG, QUANTITATIVE, PREGNANCY: hCG, Beta Chain, Quant, S: 1 m[IU]/mL (ref <5)

## 2021-12-25 NOTE — ED Provider Triage Note (Cosign Needed Addendum)
Emergency Medicine Provider Triage Evaluation Note  Emily Massey , a 26 y.o. adult  was evaluated in triage.  Pt complains of " lumps in groin" for the past 2 weeks.  She states that there has been increasingly more painful.  Denies urinary symptoms or vaginal discharge.  Does note that she has had potentially black tarry stools in the past couple weeks but no hematochezia.  Denies symptoms prior.  Denies fever, chills, night sweats, chest pain, shortness of breath, abdominal pain, nausea/vomiting. .  Review of Systems  Positive: See above Negative:   Physical Exam  BP 134/76 (BP Location: Right Arm)   Pulse (!) 105   Temp 99.2 F (37.3 C) (Oral)   Resp 16   SpO2 100%  Gen:   Awake, no distress   Resp:  Normal effort  MSK:   Moves extremities without difficulty  Other:  With chaperone present, genitourinary area briefly examined.  Bilateral bulging in inguinal region noted.  Both areas tender to touch.  Medical Decision Making  Medically screening exam initiated at 1:58 PM.  Appropriate orders placed.  Emily Massey was informed that the remainder of the evaluation will be completed by another provider, this initial triage assessment does not replace that evaluation, and the importance of remaining in the ED until their evaluation is complete.    Peter Garter, [place] 12/25/21 1402    Peter Garter, [place] 12/25/21 1402

## 2021-12-25 NOTE — ED Provider Notes (Signed)
  [facility] EMERGENCY DEPARTMENT Provider Note   CSN: [id number] Arrival date & time: 12/25/21  1325     History {Add pertinent medical, surgical, social history, OB history to HPI:1} No chief complaint on file.   Emily Massey is a 26 y.o. adult.  HPI     Home Medications Prior to Admission medications   Medication Sig Start Date End Date Taking? Authorizing Provider  acetaminophen (TYLENOL) 500 MG tablet Take 2 tablets (1,000 mg total) by mouth every 6 (six) hours as needed for headache. 12/04/20   Fayrene Helper, PA-C  albuterol (PROVENTIL HFA;VENTOLIN HFA) 108 (90 BASE) MCG/ACT inhaler Inhale 2 puffs into the lungs every 6 (six) hours as needed for wheezing.  03/07/13   Jolene Schimke, NP  ciprofloxacin (CIPRO) 500 MG tablet Take 1 tablet (500 mg total) by mouth every 12 (twelve) hours. 03/21/21   Ivette Loyal, NP  fluticasone (FLONASE) 50 MCG/ACT nasal spray Place 2 sprays into both nostrils daily. 03/01/19   Couture, Cortni S, PA-C  ondansetron (ZOFRAN ODT) 4 MG disintegrating tablet Take 1 tablet (4 mg total) by mouth every 8 (eight) hours as needed for nausea or vomiting. 03/21/21   Ivette Loyal, NP  polyethylene glycol [facility]) packet Take 17 g by mouth daily. Patient not taking: Reported on 05/21/2017 09/21/16   Vanetta Mulders, MD      Allergies    Bee pollen-1000-royal jelly [nutritional supplements], Bee venom, Gabapentin, Mushroom extract complex, and Tramadol    Review of Systems   Review of Systems  Physical Exam Updated Vital Signs BP 119/69   Pulse 77   Temp 99.2 F (37.3 C) (Oral)   Resp 16   SpO2 100%  Physical Exam  ED Results / Procedures / Treatments   Labs (all labs ordered are listed, but only abnormal results are displayed) Labs Reviewed  COMPREHENSIVE METABOLIC PANEL - Abnormal; Notable for the following components:      Result Value   BUN <5 (*)    Calcium 8.6 (*)    Albumin 3.4 (*)    All other components within  normal limits  CBC WITH DIFFERENTIAL/PLATELET  HCG, QUANTITATIVE, PREGNANCY  RAPID HIV SCREEN (HIV 1/2 AB+AG)  RPR  URINALYSIS, ROUTINE W REFLEX MICROSCOPIC    EKG None  Radiology No results found.  Procedures Procedures  {Document cardiac monitor, telemetry assessment procedure when appropriate:1}  Medications Ordered in ED Medications - No data to display  ED Course/ Medical Decision Making/ A&P                           Medical Decision Making Amount and/or Complexity of Data Reviewed Labs: ordered. Radiology: ordered.   ***  {Document critical care time when appropriate:1} {Document review of labs and clinical decision tools ie heart score, Chads2Vasc2 etc:1}  {Document your independent review of radiology images, and any outside records:1} {Document your discussion with family members, caretakers, and with consultants:1} {Document social determinants of health affecting pt's care:1} {Document your decision making why or why not admission, treatments were needed:1} Final Clinical Impression(s) / ED Diagnoses Final diagnoses:  None    Rx / DC Orders ED Discharge Orders     None

## 2021-12-25 NOTE — ED Triage Notes (Signed)
Patient complains of raised areas in groin x 2 weeks, tender to touch, no other associated symptoms. Denies discharge. Alert and oriernted

## 2021-12-25 NOTE — ED Notes (Signed)
After 3 attempts to start an IV, I put in for IV team but the pt got dressed and walked out. Dr. Rhunette Croft notified.

## 2022-01-04 ENCOUNTER — Encounter (HOSPITAL_COMMUNITY): Payer: Self-pay

## 2022-01-04 ENCOUNTER — Emergency Department (HOSPITAL_COMMUNITY)
Admission: EM | Admit: 2022-01-04 | Discharge: 2022-01-04 | Disposition: A | Discharge status: Home / Self Care | Payer: Medicaid - Out of State | Attending: Emergency Medicine | Admitting: Emergency Medicine

## 2022-01-04 ENCOUNTER — Emergency Department (HOSPITAL_COMMUNITY): Payer: Medicaid - Out of State

## 2022-01-04 DIAGNOSIS — R590 Localized enlarged lymph nodes: Secondary | ICD-10-CM | POA: Insufficient documentation

## 2022-01-04 DIAGNOSIS — J45909 Unspecified asthma, uncomplicated: Secondary | ICD-10-CM | POA: Diagnosis not present

## 2022-01-04 DIAGNOSIS — Z7951 Long term (current) use of inhaled steroids: Secondary | ICD-10-CM | POA: Diagnosis not present

## 2022-01-04 DIAGNOSIS — R1909 Other intra-abdominal and pelvic swelling, mass and lump: Secondary | ICD-10-CM | POA: Diagnosis present

## 2022-01-04 DIAGNOSIS — R82998 Other abnormal findings in urine: Secondary | ICD-10-CM | POA: Insufficient documentation

## 2022-01-04 LAB — CBC WITH DIFFERENTIAL/PLATELET
Abs Immature Granulocytes: 0.01 10*3/uL (ref 0.00–0.07)
Basophils Absolute: 0 10*3/uL (ref 0.0–0.1)
Basophils Relative: 0 %
Eosinophils Absolute: 0 10*3/uL (ref 0.0–0.5)
Eosinophils Relative: 0 %
HCT: 47.3 % — ABNORMAL HIGH (ref 36.0–46.0)
Hemoglobin: 14.9 g/dL (ref 12.0–15.0)
Immature Granulocytes: 0 %
Lymphocytes Relative: 40 %
Lymphs Abs: 2.2 10*3/uL (ref 0.7–4.0)
MCH: 26.8 pg (ref 26.0–34.0)
MCHC: 31.5 g/dL (ref 30.0–36.0)
MCV: 85.1 fL (ref 80.0–100.0)
Monocytes Absolute: 0.4 10*3/uL (ref 0.1–1.0)
Monocytes Relative: 8 %
Neutro Abs: 2.8 10*3/uL (ref 1.7–7.7)
Neutrophils Relative %: 52 %
Platelets: 339 10*3/uL (ref 150–400)
RBC: 5.56 MIL/uL — ABNORMAL HIGH (ref 3.87–5.11)
RDW: 15.4 % (ref 11.5–15.5)
WBC: 5.5 10*3/uL (ref 4.0–10.5)
nRBC: 0 % (ref 0.0–0.2)

## 2022-01-04 LAB — COMPREHENSIVE METABOLIC PANEL
ALT: 12 U/L (ref 0–44)
AST: 16 U/L (ref 15–41)
Albumin: 4 g/dL (ref 3.5–5.0)
Alkaline Phosphatase: 79 U/L (ref 38–126)
Anion gap: 10 (ref 5–15)
BUN: 10 mg/dL (ref 6–20)
CO2: 22 mmol/L (ref 22–32)
Calcium: 9.1 mg/dL (ref 8.9–10.3)
Chloride: 107 mmol/L (ref 98–111)
Creatinine, Ser: 0.95 mg/dL (ref 0.44–1.00)
GFR, Estimated: >60 mL/min (ref >60)
Glucose, Bld: 74 mg/dL (ref 70–99)
Potassium: 3.1 mmol/L — ABNORMAL LOW (ref 3.5–5.1)
Sodium: 139 mmol/L (ref 135–145)
Total Bilirubin: 0.5 mg/dL (ref 0.3–1.2)
Total Protein: 9 g/dL — ABNORMAL HIGH (ref 6.5–8.1)

## 2022-01-04 LAB — LIPASE, BLOOD: Lipase: 24 U/L (ref 11–51)

## 2022-01-04 LAB — URINALYSIS, ROUTINE W REFLEX MICROSCOPIC
Bilirubin Urine: NEGATIVE
Glucose, UA: NEGATIVE mg/dL
Ketones, ur: 20 mg/dL — AB
Nitrite: POSITIVE — AB
Protein, ur: 30 mg/dL — AB
Specific Gravity, Urine: 1.032 — ABNORMAL HIGH (ref 1.005–1.030)
pH: 5 (ref 5.0–8.0)

## 2022-01-04 LAB — I-STAT BETA HCG BLOOD, ED (MC, WL, AP ONLY): I-stat hCG, quantitative: <5 m[IU]/mL (ref <5)

## 2022-01-04 MED ORDER — DOXYCYCLINE HYCLATE 100 MG PO CAPS: 100.0000 mg | ORAL_CAPSULE | Freq: Two times a day (BID) | ORAL | 0 refills | Status: AC

## 2022-01-04 MED ORDER — DOXYCYCLINE HYCLATE 100 MG PO TABS
100.0000 mg | ORAL_TABLET | Freq: Once | ORAL | Status: AC
  Administered 2022-01-04: 100 mg via ORAL
  Filled 2022-01-04: qty 1

## 2022-01-04 MED ORDER — HYDROCODONE-ACETAMINOPHEN 5-325 MG PO TABS
1.0000 | ORAL_TABLET | Freq: Once | ORAL | Status: AC
  Administered 2022-01-04: 1 via ORAL
  Filled 2022-01-04: qty 1

## 2022-01-04 MED ORDER — HYDROCODONE-ACETAMINOPHEN 5-325 MG PO TABS: 1.0000 | ORAL_TABLET | Freq: Four times a day (QID) | ORAL | 0 refills | Status: AC | PRN

## 2022-01-04 NOTE — ED Triage Notes (Signed)
Pt states she was screen at [facility] on 6/18 for "lumps" on both sides of groin. Ultimately left AMA due to frustration over IV access before MRI was able to be obtained. Pt returns due to increased size and pain.
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
## 2022-01-04 NOTE — Discharge Instructions (Addendum)
Start the antibiotic tomorrow because you got your first dose tonight.  Somebody from hematology/oncology should call you for a follow-up appointment which you should go to especially if the lymph nodes or not getting any better.

## 2022-01-04 NOTE — ED Provider Triage Note (Signed)
Emergency Medicine Provider Triage Evaluation Note  Lillyann Ahart , a 26 y.o. adult  was evaluated in triage.  Pt complains of groin swelling ongoing for about a month.  Increasing in size.  Denies nausea vomiting, fever, or other complaints.  Appears comfortable during exam.    Review of Systems  Positive: As above Negative: As above  Physical Exam  BP (!) 148/102 (BP Location: Left Arm)   Pulse 94   Temp 98 F (36.7 C) (Oral)   Resp 16   Ht 5\' 6"  (1.676 m)   Wt 70.8 kg   SpO2 100%   BMI 25.18 kg/m  Gen:   Awake, no distress   Resp:  Normal effort  MSK:   Moves extremities without difficulty  Other:    Medical Decision Making  Medically screening exam initiated at 4:29 PM.  Appropriate orders placed.  Coda Filler was informed that the remainder of the evaluation will be completed by another provider, this initial triage assessment does not replace that evaluation, and the importance of remaining in the ED until their evaluation is complete.     [name], PA-C 01/04/22 1630

## 2022-01-05 LAB — HIV ANTIBODY (ROUTINE TESTING W REFLEX): HIV Screen 4th Generation wRfx: NONREACTIVE

## 2022-01-05 LAB — RPR: RPR Ser Ql: NONREACTIVE

## 2022-01-06 ENCOUNTER — Telehealth: Payer: Self-pay | Admitting: Physician Assistant

## 2022-01-06 LAB — URINE CULTURE

## 2022-01-06 NOTE — Telephone Encounter (Signed)
Scheduled appt per 6/28 referral. Pt is aware of appt date and time. Pt is aware to arrive 15 mins prior to appt time and to bring and updated insurance card. Pt is aware of appt location.   

## 2022-01-09 ENCOUNTER — Inpatient Hospital Stay: Payer: Medicaid - Out of State | Attending: Physician Assistant | Admitting: Physician Assistant

## 2022-01-09 ENCOUNTER — Inpatient Hospital Stay: Payer: Medicaid - Out of State

## 2022-01-12 ENCOUNTER — Telehealth: Payer: Self-pay | Admitting: Physician Assistant

## 2022-01-12 NOTE — Telephone Encounter (Signed)
R/s pt's appt per pt request. Pt is aware of new appt date/time.  

## 2022-01-24 ENCOUNTER — Inpatient Hospital Stay: Payer: Medicaid - Out of State | Admitting: Physician Assistant

## 2022-01-24 ENCOUNTER — Inpatient Hospital Stay: Payer: Medicaid - Out of State

## 2022-05-04 ENCOUNTER — Emergency Department (HOSPITAL_COMMUNITY): Payer: Medicaid - Out of State

## 2022-05-04 ENCOUNTER — Emergency Department (HOSPITAL_COMMUNITY)
Admission: EM | Admit: 2022-05-04 | Discharge: 2022-05-05 | Discharge status: Against Medical Advice | Payer: Medicaid - Out of State | Attending: Student | Admitting: Student

## 2022-05-04 ENCOUNTER — Encounter (HOSPITAL_COMMUNITY): Payer: Self-pay

## 2022-05-04 DIAGNOSIS — S00401A Unspecified superficial injury of right ear, initial encounter: Secondary | ICD-10-CM | POA: Diagnosis present

## 2022-05-04 DIAGNOSIS — M542 Cervicalgia: Secondary | ICD-10-CM | POA: Insufficient documentation

## 2022-05-04 DIAGNOSIS — S01311A Laceration without foreign body of right ear, initial encounter: Secondary | ICD-10-CM | POA: Insufficient documentation

## 2022-05-04 DIAGNOSIS — R519 Headache, unspecified: Secondary | ICD-10-CM | POA: Diagnosis not present

## 2022-05-04 DIAGNOSIS — Z5321 Procedure and treatment not carried out due to patient leaving prior to being seen by health care provider: Secondary | ICD-10-CM | POA: Insufficient documentation

## 2022-05-04 DIAGNOSIS — W228XXA Striking against or struck by other objects, initial encounter: Secondary | ICD-10-CM | POA: Diagnosis not present
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
## 2023-08-18 ENCOUNTER — Encounter (HOSPITAL_BASED_OUTPATIENT_CLINIC_OR_DEPARTMENT_OTHER): Payer: Self-pay | Admitting: Emergency Medicine

## 2023-08-18 ENCOUNTER — Emergency Department (HOSPITAL_BASED_OUTPATIENT_CLINIC_OR_DEPARTMENT_OTHER): Payer: MEDICAID | Admitting: Radiology

## 2023-08-18 ENCOUNTER — Emergency Department (HOSPITAL_BASED_OUTPATIENT_CLINIC_OR_DEPARTMENT_OTHER)
Admission: EM | Admit: 2023-08-18 | Discharge: 2023-08-18 | Disposition: A | Discharge status: Home / Self Care | Payer: MEDICAID | Attending: Emergency Medicine | Admitting: Emergency Medicine

## 2023-08-18 DIAGNOSIS — M549 Dorsalgia, unspecified: Secondary | ICD-10-CM | POA: Diagnosis present

## 2023-08-18 DIAGNOSIS — M546 Pain in thoracic spine: Secondary | ICD-10-CM | POA: Diagnosis not present

## 2023-08-18 DIAGNOSIS — Z7951 Long term (current) use of inhaled steroids: Secondary | ICD-10-CM | POA: Diagnosis not present

## 2023-08-18 DIAGNOSIS — J45909 Unspecified asthma, uncomplicated: Secondary | ICD-10-CM | POA: Insufficient documentation

## 2023-08-18 MED ORDER — PREDNISONE 20 MG PO TABS: 40.0000 mg | ORAL_TABLET | Freq: Every day | ORAL | 0 refills | Status: AC

## 2023-08-18 MED ORDER — KETOROLAC TROMETHAMINE 15 MG/ML IJ SOLN
15.0000 mg | Freq: Once | INTRAMUSCULAR | Status: AC
  Administered 2023-08-18: 15 mg via INTRAMUSCULAR
  Filled 2023-08-18: qty 1

## 2023-08-18 MED ORDER — CYCLOBENZAPRINE HCL 10 MG PO TABS
10.0000 mg | ORAL_TABLET | Freq: Once | ORAL | Status: AC
  Administered 2023-08-18: 10 mg via ORAL
  Filled 2023-08-18: qty 1

## 2023-08-18 MED ORDER — CYCLOBENZAPRINE HCL 10 MG PO TABS: 10.0000 mg | ORAL_TABLET | Freq: Two times a day (BID) | ORAL | 0 refills | Status: AC | PRN

## 2023-08-18 NOTE — ED Notes (Addendum)
 Pt bib [facility], back pain not responding to tylenol  130/70 95 HR 95 RA 18

## 2023-08-18 NOTE — ED Notes (Signed)
 Pt reports will sign waiver denying pregnancy for imaging. Imaging notified, spoke with Kayleen Party
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
## 2023-08-18 NOTE — ED Notes (Signed)
 Pt transported to imaging.
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
## 2023-08-18 NOTE — ED Notes (Signed)
 RN reviewed discharge instructions with pt. Pt verbalized understanding and had no further questions. VSS upon discharge.

## 2023-08-18 NOTE — Discharge Instructions (Addendum)
 You have been seen today for your complaint of back pain. Your imaging was overall reassuring. Your discharge medications include Flexeril . This is a muscle relaxer. It may cause drowsiness. Do not drive, operate heavy machinery or make important decisions when taking this medication. Only take it at night until you know how it affects you. Only take it as needed and take other medications such as ibuprofen  or tylenol  prior to trying this medication.  Alternate tylenol  and ibuprofen  for pain. You may alternate these every 4 hours. You may take up to 800 mg of ibuprofen  at a time and up to 1000 mg of tylenol . Prednisone .  This is a steroid.  Take it as prescribed and for the entire duration of the prescription. Home care instructions are as follows:  Perform gentle stretching exercises daily Follow up with: Dr. Edna.  He is an investment banker, operational.  Call to schedule follow-up appointment. Please seek immediate medical care if you develop any of the following symptoms: You develop new bowel or bladder control problems. You have unusual weakness or numbness in your arms or legs. You feel faint. At this time there does not appear to be the presence of an emergent medical condition, however there is always the potential for conditions to change. Please read and follow the below instructions.  Do not take your medicine if  develop an itchy rash, swelling in your mouth or lips, or difficulty breathing; call 911 and seek immediate emergency medical attention if this occurs.  You may review your lab tests and imaging results in their entirety on your MyChart account.  Please discuss all results of fully with your primary care provider and other specialist at your follow-up visit.  Note: Portions of this text may have been transcribed using voice recognition software. Every effort was made to ensure accuracy; however, inadvertent computerized transcription errors may still be present.
